# Patient Record
Sex: Male | Born: 1935 | Race: White | Hispanic: No | Marital: Married | Smoking: Never smoker
Health system: Southern US, Community
[De-identification: ages and names within clinical notes are randomized; demographics above are authoritative.]

## PROBLEM LIST (undated history)

## (undated) DIAGNOSIS — Z2809 Immunization not carried out because of other contraindication: Secondary | ICD-10-CM

## (undated) DIAGNOSIS — M1990 Unspecified osteoarthritis, unspecified site: Secondary | ICD-10-CM

## (undated) DIAGNOSIS — E785 Hyperlipidemia, unspecified: Secondary | ICD-10-CM

## (undated) DIAGNOSIS — I10 Essential (primary) hypertension: Secondary | ICD-10-CM

## (undated) DIAGNOSIS — Z23 Encounter for immunization: Secondary | ICD-10-CM

## (undated) DIAGNOSIS — R011 Cardiac murmur, unspecified: Secondary | ICD-10-CM

## (undated) DIAGNOSIS — N400 Enlarged prostate without lower urinary tract symptoms: Secondary | ICD-10-CM

## (undated) DIAGNOSIS — R002 Palpitations: Secondary | ICD-10-CM

## (undated) DIAGNOSIS — K562 Volvulus: Secondary | ICD-10-CM

## (undated) DIAGNOSIS — I428 Other cardiomyopathies: Secondary | ICD-10-CM

## (undated) DIAGNOSIS — C61 Malignant neoplasm of prostate: Secondary | ICD-10-CM

## (undated) DIAGNOSIS — N529 Male erectile dysfunction, unspecified: Secondary | ICD-10-CM

## (undated) DIAGNOSIS — G709 Myoneural disorder, unspecified: Secondary | ICD-10-CM

## (undated) HISTORY — DX: Unspecified osteoarthritis, unspecified site: M19.90

## (undated) HISTORY — PX: SMALL INTESTINE SURGERY: SHX150

## (undated) HISTORY — DX: Volvulus: K56.2

## (undated) HISTORY — DX: Immunization not carried out because of other contraindication: Z28.09

## (undated) HISTORY — DX: Encounter for immunization: Z23

## (undated) HISTORY — DX: Palpitations: R00.2

## (undated) HISTORY — DX: Essential (primary) hypertension: I10

## (undated) HISTORY — DX: Cardiac murmur, unspecified: R01.1

## (undated) HISTORY — DX: Male erectile dysfunction, unspecified: N52.9

## (undated) HISTORY — PX: EYE SURGERY: SHX253

## (undated) HISTORY — DX: Myoneural disorder, unspecified: G70.9

## (undated) HISTORY — DX: Other cardiomyopathies: I42.8

## (undated) HISTORY — DX: Hyperlipidemia, unspecified: E78.5

## (undated) HISTORY — PX: CERVICAL FUSION: SHX112

## (undated) HISTORY — DX: Malignant neoplasm of prostate: C61

## (undated) HISTORY — PX: PARTIAL COLECTOMY: SHX5273

## (undated) HISTORY — DX: Benign prostatic hyperplasia without lower urinary tract symptoms: N40.0

---

## 1993-08-12 HISTORY — PX: COLON RESECTION: SHX5231

## 2000-12-05 ENCOUNTER — Encounter: Payer: Self-pay | Admitting: Internal Medicine

## 2000-12-05 ENCOUNTER — Encounter: Admission: RE | Admit: 2000-12-05 | Discharge: 2000-12-05 | Payer: Self-pay | Admitting: Internal Medicine

## 2001-07-29 ENCOUNTER — Encounter: Payer: Self-pay | Admitting: Internal Medicine

## 2001-07-29 ENCOUNTER — Encounter: Admission: RE | Admit: 2001-07-29 | Discharge: 2001-07-29 | Payer: Self-pay | Admitting: Internal Medicine

## 2001-11-11 ENCOUNTER — Ambulatory Visit (HOSPITAL_COMMUNITY): Admission: RE | Admit: 2001-11-11 | Discharge: 2001-11-11 | Payer: Self-pay | Admitting: Gastroenterology

## 2001-11-11 ENCOUNTER — Encounter (INDEPENDENT_AMBULATORY_CARE_PROVIDER_SITE_OTHER): Payer: Self-pay | Admitting: *Deleted

## 2003-08-13 HISTORY — PX: PROSTATE SURGERY: SHX751

## 2004-01-10 ENCOUNTER — Encounter: Admission: RE | Admit: 2004-01-10 | Discharge: 2004-01-10 | Payer: Self-pay | Admitting: Internal Medicine

## 2005-08-22 ENCOUNTER — Ambulatory Visit (HOSPITAL_COMMUNITY): Admission: RE | Admit: 2005-08-22 | Discharge: 2005-08-22 | Payer: Self-pay | Admitting: Orthopaedic Surgery

## 2005-08-22 ENCOUNTER — Ambulatory Visit: Payer: Self-pay | Admitting: Cardiology

## 2005-08-23 ENCOUNTER — Encounter: Payer: Self-pay | Admitting: Cardiology

## 2005-08-23 ENCOUNTER — Ambulatory Visit: Payer: Self-pay

## 2005-08-27 ENCOUNTER — Ambulatory Visit: Payer: Self-pay | Admitting: Cardiology

## 2005-08-27 ENCOUNTER — Ambulatory Visit (HOSPITAL_COMMUNITY): Admission: RE | Admit: 2005-08-27 | Discharge: 2005-08-27 | Payer: Self-pay | Admitting: Cardiology

## 2005-09-12 ENCOUNTER — Ambulatory Visit: Payer: Self-pay | Admitting: Cardiology

## 2005-09-16 ENCOUNTER — Ambulatory Visit (HOSPITAL_COMMUNITY): Admission: RE | Admit: 2005-09-16 | Discharge: 2005-09-17 | Payer: Self-pay | Admitting: Orthopaedic Surgery

## 2006-04-09 ENCOUNTER — Ambulatory Visit: Payer: Self-pay | Admitting: Cardiology

## 2006-08-06 ENCOUNTER — Ambulatory Visit: Payer: Self-pay | Admitting: Cardiology

## 2006-08-06 ENCOUNTER — Inpatient Hospital Stay (HOSPITAL_COMMUNITY): Admission: AD | Admit: 2006-08-06 | Discharge: 2006-08-08 | Payer: Self-pay | Admitting: Orthopaedic Surgery

## 2006-08-12 DIAGNOSIS — Z2809 Immunization not carried out because of other contraindication: Secondary | ICD-10-CM

## 2006-08-12 HISTORY — DX: Immunization not carried out because of other contraindication: Z28.09

## 2006-09-16 ENCOUNTER — Ambulatory Visit: Payer: Self-pay | Admitting: Cardiology

## 2006-09-18 ENCOUNTER — Encounter: Payer: Self-pay | Admitting: Cardiology

## 2006-09-18 ENCOUNTER — Ambulatory Visit: Payer: Self-pay

## 2007-02-10 ENCOUNTER — Ambulatory Visit: Payer: Self-pay | Admitting: Cardiology

## 2008-04-19 ENCOUNTER — Ambulatory Visit: Payer: Self-pay | Admitting: Cardiology

## 2008-09-27 ENCOUNTER — Ambulatory Visit: Admission: RE | Admit: 2008-09-27 | Discharge: 2008-12-26 | Payer: Self-pay | Admitting: Radiation Oncology

## 2008-09-30 ENCOUNTER — Encounter: Admission: RE | Admit: 2008-09-30 | Discharge: 2008-09-30 | Payer: Self-pay | Admitting: Urology

## 2008-10-31 ENCOUNTER — Ambulatory Visit (HOSPITAL_BASED_OUTPATIENT_CLINIC_OR_DEPARTMENT_OTHER): Admission: RE | Admit: 2008-10-31 | Discharge: 2008-10-31 | Payer: Self-pay | Admitting: Urology

## 2009-04-16 DIAGNOSIS — E785 Hyperlipidemia, unspecified: Secondary | ICD-10-CM | POA: Insufficient documentation

## 2009-04-16 DIAGNOSIS — I10 Essential (primary) hypertension: Secondary | ICD-10-CM | POA: Insufficient documentation

## 2009-04-16 DIAGNOSIS — Z87448 Personal history of other diseases of urinary system: Secondary | ICD-10-CM | POA: Insufficient documentation

## 2009-04-16 DIAGNOSIS — R002 Palpitations: Secondary | ICD-10-CM | POA: Insufficient documentation

## 2009-04-16 DIAGNOSIS — I428 Other cardiomyopathies: Secondary | ICD-10-CM | POA: Insufficient documentation

## 2009-04-16 DIAGNOSIS — Z87898 Personal history of other specified conditions: Secondary | ICD-10-CM | POA: Insufficient documentation

## 2009-04-20 ENCOUNTER — Ambulatory Visit: Payer: Self-pay | Admitting: Cardiovascular Disease

## 2009-04-20 DIAGNOSIS — I10 Essential (primary) hypertension: Secondary | ICD-10-CM | POA: Insufficient documentation

## 2009-04-20 DIAGNOSIS — I429 Cardiomyopathy, unspecified: Secondary | ICD-10-CM | POA: Insufficient documentation

## 2009-04-20 DIAGNOSIS — I059 Rheumatic mitral valve disease, unspecified: Secondary | ICD-10-CM | POA: Insufficient documentation

## 2009-05-04 ENCOUNTER — Ambulatory Visit: Payer: Self-pay

## 2009-05-04 ENCOUNTER — Encounter: Payer: Self-pay | Admitting: Cardiovascular Disease

## 2010-04-24 ENCOUNTER — Ambulatory Visit: Payer: Self-pay | Admitting: Cardiovascular Disease

## 2010-04-24 DIAGNOSIS — M79609 Pain in unspecified limb: Secondary | ICD-10-CM | POA: Insufficient documentation

## 2010-04-25 ENCOUNTER — Encounter: Payer: Self-pay | Admitting: Cardiovascular Disease

## 2010-04-25 ENCOUNTER — Ambulatory Visit: Payer: Self-pay

## 2010-09-11 NOTE — Assessment & Plan Note (Signed)
Summary: f1y  Medications Added LEVITRA 20 MG TABS (VARDENAFIL HCL) take as directed as needed AMLODIPINE BESYLATE 5 MG TABS (AMLODIPINE BESYLATE) 1 tab once daily FLAX SEED OIL 1000 MG CAPS (FLAXSEED (LINSEED)) 3-4  caps daily      Allergies Added: NKDA  Visit Type:  1 yr f/u Primary Provider:  Burton Apley, MD  CC:  pt c/o right leg cramps...no other cardiac complaints today.  History of Present Illness: 75 yo WM with history of HTN, HL, cardiomyopathy (resolved), BPH and palpitations here today for follow up. He has been doing well. He has had treatment for prostate cancer. He has had no further palpitations. He denies chest pain, SOB, near syncope or syncope. Echo in September 2010 with normal LV function.  He has been feeling well.  He has stopped his beta blocker secondary to fatigue and erectile dysfunction. He has noticed right calf pain with ambulation. This resolves with rest. No edema.   Current Medications (verified): 1)  Aspirin Ec 325 Mg Tbec (Aspirin) .... Take One Tablet By Mouth Daily 2)  Multivitamins   Tabs (Multiple Vitamin) .Marland Kitchen.. 1 Tab Once Daily 3)  Lovastatin 20 Mg Tabs (Lovastatin) .Marland Kitchen.. 1 Tab At Bedtime 4)  Glucosamine 1500 Complex  Caps (Glucosamine-Chondroit-Vit C-Mn) .Marland Kitchen.. 1 Cap Once Daily 5)  Saw Palmetto 160 Mg Tabs (Saw Palmetto (Serenoa Repens)) .... 2 Tabs Once Daily 6)  Calcium 600 1500 Mg Tabs (Calcium Carbonate) .Marland Kitchen.. 1 Tab Once Daily 7)  Levitra 20 Mg Tabs (Vardenafil Hcl) .... Take As Directed As Needed 8)  Amlodipine Besylate 5 Mg Tabs (Amlodipine Besylate) .Marland Kitchen.. 1 Tab Once Daily 9)  Flax Seed Oil 1000 Mg Caps (Flaxseed (Linseed)) .... 3-4  Caps Daily  Allergies (verified): No Known Drug Allergies  Past History:  Past Medical History: Reviewed history from 04/16/2009 and no changes required. 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 Retired 2003 Married  Tobacco Use - Former.  Alcohol Use - no Regular Exercise - yes Drug Use - no  Review of Systems  The patient denies fatigue, malaise, fever, weight gain/loss, vision loss, decreased hearing, hoarseness, chest pain, palpitations, shortness of breath, prolonged cough, wheezing, sleep apnea, coughing up blood, abdominal pain, blood in stool, nausea, vomiting, diarrhea, heartburn, incontinence, blood in urine, muscle weakness, joint pain, leg swelling, rash, skin lesions, headache, fainting, dizziness, depression, anxiety, enlarged lymph nodes, easy bruising or bleeding, and environmental allergies.    Vital Signs:  Patient profile:   75 year old male Height:      73 inches Weight:      196.8 pounds BMI:     26.06 Pulse rate:   75 / minute Pulse rhythm:   irregular BP sitting:   136 / 80  (left arm) Cuff size:   regular  Vitals Entered By: Danielle Rankin, CMA (April 24, 2010 9:30 AM)  Physical Exam  General:  General: Well developed, well nourished, NAD Musculoskeletal: Muscle strength 5/5 all ext Psychiatric: Mood and affect normal Neck: No JVD, no carotid bruits, no thyromegaly, no lymphadenopathy. Lungs:Clear bilaterally, no wheezes, rhonci, crackles CV: RRR no murmurs, gallops rubs Abdomen: soft, NT, ND, BS present Extremities: No edema, pulses 1+ bilateral DP, non-palpable bilateral PT    Echocardiogram  Procedure date:  05/04/2009  Findings:      1. Left ventricle: The cavity size  was at the upper limits of        normal. Wall thickness was at the upper limits of normal. The        estimated ejection fraction was 60%. Wall motion was normal;        there were no regional wall motion abnormalities.     2. Aortic valve: Trivialregurgitation.     3. Mitral valve: There is mild prolapse of the posterior  leaflet.        There is a late MR jet from this. The Pissa data can be        missleading because the jet is late. Overall I feel the MR is        mild.     4. Left atrium: The atrium was mildly dilated.  EKG  Procedure date:  04/24/2010  Findings:      Sinus rhythm, rate 75 bpm. PVCs.   Impression & Recommendations:  Problem # 1:  LIMB PAIN (ICD-729.5)  Will get ABIs. May be consistent with claudication.   Orders: Arterial Duplex Lower Extremity (Arterial Duplex Low)  Problem # 2:  PALPITATIONS (ICD-785.1)  He is not aware of any palpitations. LV function is normal. PVCs on EKG. He does not wish to take therapy for this. In the setting of normal LV function, PVCs are benign.   The following medications were removed from the medication list:    Metoprolol Tartrate 25 Mg Tabs (Metoprolol tartrate) .Marland Kitchen... 1/2 tab two times a day His updated medication list for this problem includes:    Aspirin Ec 325 Mg Tbec (Aspirin) .Marland Kitchen... Take one tablet by mouth daily    Amlodipine Besylate 5 Mg Tabs (Amlodipine besylate) .Marland Kitchen... 1 tab once daily  Orders: EKG w/ Interpretation (93000)  Patient Instructions: 1)  Your physician recommends that you continue on your current medications as directed. Please refer to the Current Medication list given to you today. 2)  Your physician has requested that you have an ankle brachial index (ABI). During this test an ultrasound and blood pressure cuff are used to evaluate the arteries that supply the arms and legs with blood. Allow thirty minutes for this exam. There are no restrictions or special instructions. 3)  Your physician has requested that you have an echocardiogram in 1 year. Echocardiography is a painless test that uses sound waves to create images of your heart. It provides your doctor with information about the size and shape of your heart and how well your heart's chambers and valves are working.  This procedure takes approximately one hour.  There are no restrictions for this procedure. 4)  Your physician recommends that you schedule a follow-up appointment in: 1 year

## 2010-11-22 LAB — CBC
HCT: 40 % (ref 39.0–52.0)
Hemoglobin: 13.9 g/dL (ref 13.0–17.0)
MCHC: 34.7 g/dL (ref 30.0–36.0)
MCV: 88 fL (ref 78.0–100.0)
Platelets: 131 10*3/uL — ABNORMAL LOW (ref 150–400)
RBC: 4.54 MIL/uL (ref 4.22–5.81)
RDW: 12.4 % (ref 11.5–15.5)
WBC: 5.8 10*3/uL (ref 4.0–10.5)

## 2010-11-22 LAB — COMPREHENSIVE METABOLIC PANEL
ALT: 18 U/L (ref 0–53)
AST: 22 U/L (ref 0–37)
Albumin: 3.5 g/dL (ref 3.5–5.2)
Alkaline Phosphatase: 56 U/L (ref 39–117)
BUN: 14 mg/dL (ref 6–23)
CO2: 29 mEq/L (ref 19–32)
Calcium: 9.1 mg/dL (ref 8.4–10.5)
Chloride: 105 mEq/L (ref 96–112)
Creatinine, Ser: 1.07 mg/dL (ref 0.4–1.5)
GFR calc Af Amer: >60 mL/min (ref >60)
GFR calc non Af Amer: >60 mL/min (ref >60)
Glucose, Bld: 99 mg/dL (ref 70–99)
Potassium: 4.8 mEq/L (ref 3.5–5.1)
Sodium: 138 mEq/L (ref 135–145)
Total Bilirubin: 0.8 mg/dL (ref 0.3–1.2)
Total Protein: 6.1 g/dL (ref 6.0–8.3)

## 2010-11-22 LAB — APTT: aPTT: 29 seconds (ref 24–37)

## 2010-11-22 LAB — PROTIME-INR
INR: 1 (ref 0.00–1.49)
Prothrombin Time: 13.6 seconds (ref 11.6–15.2)

## 2010-12-25 NOTE — Assessment & Plan Note (Signed)
[facility]                            CARDIOLOGY OFFICE NOTE   AZAN, MANERI                      MRN:          [id number]  DATE:04/19/2008                            DOB:          July 24, 1936    HISTORY OF PRESENT ILLNESS:  Mr. Hellberg is a pleasant 75 year old  Caucasian male with a past medical history significant for hypertension,  hyperlipidemia, erectile dysfunction, benign prostatic hypertrophy,  palpitations, and cardiomyopathy that has resolved over the last several  years, who presents today for routine cardiology followup.  Mr. Mirabile  was followed by Dr. Nona Dell in this office, and because of Dr.  Ival Bible transfer to the [facility] office, Mr. Seppala will begin following  in my clinic.  He states that he has been doing very well and denies  having noticed any palpitations at all.  He also denies any chest  discomfort, shortness of breath, dizziness, near syncope, syncope,  orthopnea, paroxysmal nocturnal dyspnea, or lower extremity edema.  He  stated that he has been busy in his church assisting and planning for  building of a new structure.  He most recently had an echocardiogram  performed on September 18, 2006 that showed normal left ventricular size  and normal left ventricular systolic function.  There was trivial aortic  valve regurgitation and borderline mitral valve prolapse.  The left  atrium was mildly dilated.   PAST MEDICAL HISTORY:  Significant for benign prostatic hypertrophy,  erectile dysfunction, hypertension, hyperlipidemia, palpitations that  were felt to be secondary to ventricular ectopy, and a mild  cardiomyopathy that has since resolved.   PAST SURGICAL HISTORY:  Significant for cervical spine surgery and an  intestinal surgery.   ALLERGIES:  He has no known drug allergies.   CURRENT MEDICATIONS INCLUDE:  1. Flomax 0.4 mg once daily.  2. Triamterene/hydrochlorothiazide 37.5/25 mg once daily.  3. Aspirin  325 mg once daily.  4. Multivitamins once daily.  5. Lovastatin 20 mg once daily.  6. Vitamin C 500 mg once daily.  7. Glucosamine 1500 mg once daily.  8. Saw Palmetto 320 mg once daily.  9. Metoprolol tartrate 12.5 mg twice daily.  10.Calcium 600 mg once daily.  11.Omega-3.  12.Fish oil 300 mg, 3 times daily.  13.Viagra p.r.n.   SOCIAL HISTORY:  The patient is a retired Airline pilot.  He is married and  has 2 adult children.  He denies use of tobacco, alcohol, or illicit  drugs.   FAMILY HISTORY:  The patient's mother and father both had coronary  artery disease.   REVIEW OF SYSTEMS:  As stated in the history of present illness, is  otherwise negative.   PHYSICAL EXAMINATION:  GENERAL:  He is a pleasant elderly Caucasian  male, in no acute distress.  VITAL SIGNS:  Weight 200 pounds, blood pressure 104/78, pulse 59 and  regular, respirations 12 and nonlabored.  NECK:  No JVD.  No carotid bruits.  No lymphadenopathy.  No thyromegaly.  SKIN:  Warm and dry.  HEENT:  Oropharynx clear.  Mucous membranes moist.  LUNGS:  Clear to auscultation bilaterally without wheezes,  rhonchi, or  crackles noted.  CARDIOVASCULAR:  Regular rate and rhythm without murmurs, gallops, or  rubs noted.  No lifts or thrills are noted.  There are normal first and  second heart sounds.  ABDOMEN:  Soft and nontender.  Bowel sounds present.  EXTREMITIES:  No evidence of edema.  Pulses are 2+ in all extremities.   DIAGNOSTIC STUDIES:  A 12-lead electrocardiogram obtained in our office  today shows sinus bradycardia and nonspecific T-wave flattening.  The  ventricular rate is 59 beats per minute.  The PR interval is 180  milliseconds.  The corrected QT interval was 405 milliseconds.   ASSESSMENT AND PLAN:  This is a pleasant 75 year old Caucasian male with  previously documented mild cardiomyopathy with ventricular ectopy that  has now resolved.  The patient has no awareness of any palpitations  since taking  beta-blocker therapy.  His most recent echocardiogram  performed over a year ago showed normalization of his left ventricular  function.  His blood pressure is well controlled today, and he is  asymptomatic.  I will continue his current medication regimen as  outlined above.  I have encouraged him to continue to follow with Dr.  Su Hilt for his primary care.  I will see him in our Cardiology Office  in 1 year.  He is aware that he should let us know should his symptoms  change in character over the ensuing 12 months.     Verne Carrow, MD  Electronically Signed    CM/MedQ  DD: 04/19/2008  DT: 04/20/2008  Job #: [id number]   cc:   Antony Madura, M.D.  Rollene Rotunda, MD, [facility]

## 2010-12-25 NOTE — Assessment & Plan Note (Signed)
[facility]                            CARDIOLOGY OFFICE NOTE   Kevin Knight                      MRN:          [id number]  DATE:02/10/2007                            DOB:          [birth year]    PRIMARY CARE PHYSICIAN:  Dr. Burton Apley.   REASON FOR VISIT:  Followup premature ventricular complexes and  cardiomyopathy.   HISTORY OF PRESENT ILLNESS:  Kevin Knight returns for a routine visit. He  continues to do well. He describes occasional palpitations sometimes  when he over does it, but he has had no dizziness or syncope. He has  had no spontaneous palpitations at rest. We proceeded with an  echocardiogram following his last visit, which actually showed an  improvement in his ejection fraction to normal, with no focal wall  motion abnormalities.   ALLERGIES:  No known drug allergies.   PRESENT MEDICATIONS:  1. Flomax 0.4 mg p.o. daily.  2. Triamterine/hydrochlorothiazide 37.5/25 mg p.o. daily  3. Aspirin 325 mg p.o. daily.  4. Multivitamin 1 p.o. daily.  5. Lovastatin 20 mg p.o. daily.  6. Toprol XL 25 mg p.o. daily.  7. Glucosamine.  8. Saw palmetto.  9. Vitamin C.   REVIEW OF SYSTEMS:  As described in history of present illness.   PHYSICAL EXAMINATION:  Blood pressure 121/69, heart rate is 59, weight  is 200 pounds. Patient is comfortable and in no acute distress.  Examination of the neck reveals no elevated jugular venous pressure  without bruits.  LUNGS: Clear without labored breathing.  CARDIAC EXAM: Reveals a regular rate and rhythm. No extrasystole is  noted. Soft systolic murmur.  EXTREMITIES: Show no pitting edema.   IMPRESSION/RECOMMENDATIONS:  1. Previously documented mild cardiomyopathy with ventricular ectopy,      now with improvement to normal ejection fraction by follow up      echocardiography in February. Our plan is to continue medical      therapy and observation. He plans to see Dr. Su Hilt back in  December and we will see him back in 1 years time.  2. Further plans to follow.     Kevin Sidle, MD  Electronically Signed    SGM/MedQ  DD: 02/10/2007  DT: 02/11/2007  Job #: [id number]   cc:   Kevin Knight, M.D.

## 2010-12-25 NOTE — Op Note (Signed)
Kevin Knight, Kevin Knight               ACCOUNT NO.:  [id number]   MEDICAL RECORD NO.:  [id number]          PATIENT TYPE:  AMB   LOCATION:  [facility]                         FACILITY:  [facility]   PHYSICIAN:  Mark C. Vernie Ammons, M.D.  DATE OF BIRTH:  1936-06-10   DATE OF PROCEDURE:  10/31/2008  DATE OF DISCHARGE:                               OPERATIVE REPORT   PREOPERATIVE DIAGNOSIS:  Adenocarcinoma of the prostate.   POSTOPERATIVE DIAGNOSIS:  Adenocarcinoma of the prostate. (Clinical  stage T1c)   PROCEDURE:  I 125 seed implantation.   SURGEON:  Mark C. Vernie Ammons, M.D.   RADIATION ONCOLOGIST:  Artist Pais. Kathrynn Running, M.D.   ANESTHESIA:  General.   DRAINS:  16-French Foley catheter.   BLOOD LOSS:  Approximately 50 mL.   SPECIMENS:  None.   COMPLICATIONS:  None.   INDICATIONS:  The patient is a 75 year old male who was found to have a  slightly elevated PSA of 4.08.  A prostate biopsy revealed some  inflammation as well as Gleason 6 adenocarcinoma involving 5% of one  core from the right apex.  I have discussed all treatment options  available to the patient and he has elected to proceed with radioactive  seed implantation.  The risks, the procedure risks and complications  have been discussed with the patient.   DESCRIPTION OF OPERATION:  After informed consent, the patient was  brought to the major OR and placed on the table and administered general  anesthesia.  He was then moved to the dorsal lithotomy position and his  genitalia as well as perineum were sterilely prepped and draped.  An  official time-out was then performed.  Dr. Kathrynn Running then placed the  transrectal ultrasound probe in the rectum and affixed this to the stand  as well as placing a rectal tube and 16-French Foley catheter in the  bladder filling the balloon with half-strength contrast.  He then  performed real time mapping of the prostate and made a plan based on  that ultrasound mapping.  Once was this was complete, I  entered and a  second official time-out was then performed.   The I 125 seeds were then placed according to the plan using the  Nucletron device and real-time ultrasound.  This proceeded without  complication and a total of 25 needles were used to place a total of 82  seeds.   The ultrasound probe and rectal probe were then removed as well as the  Foley catheter.  I then performed flexible cystoscopy and noted the  urethra to be entirely normal down the sphincter which appeared intact.  The prostatic urethra revealed a single seed within the prostatic  urethra.  It was slightly elongated with some mild bilobar hypertrophy  but no prostatic urethral lesions were identified and there was no  active bleeding noted.  Upon entering the bladder I did a systematic  inspection of the bladder, noted no tumor, stones or inflammatory  lesions.  Ureteral orifices were of normal configuration and position  and I also retroflexed the retroflexed the scope and noted no seeds  protruding from the  base of the prostate.   I then used a grasper to grasp the single seed that had flushed into the  bladder and removed that.  I then reinspected the bladder, noted no  bleeding or seeds present and therefore removed the cystoscope and  reinserted a new 16-French Foley catheter and connected this to close  system drainage.  The patient tolerated the procedure well and was  awakened and taken to recovery room in stable satisfactory condition.   He will be given a prescription for 28 Vicodin ES, for ten 500 mg Cipro  and remain on Flomax 0.4 mg q.h.s.  He was given written instructions  and his follow-up will be in 3 weeks to see Dr. Kathrynn Running and myself at  that time.      Mark C. Vernie Ammons, M.D.  Electronically Signed     MCO/MEDQ  D:  10/31/2008  T:  10/31/2008  Job:  [id number]   cc:   Artist Pais Kathrynn Running, M.D.  Fax: [phone]

## 2010-12-28 NOTE — Op Note (Signed)
Kevin Knight, Kevin Knight               ACCOUNT NO.:  [id number]   MEDICAL RECORD NO.:  [id number]          PATIENT TYPE:  AMB   LOCATION:  [facility]S                          FACILITY:  [facility]   PHYSICIAN:  Mark C. Ophelia Charter, M.D.    DATE OF BIRTH:  [birth year]   DATE OF PROCEDURE:  08/06/2006  DATE OF DISCHARGE:                               OPERATIVE REPORT   PREOPERATIVE DIAGNOSIS:  C5-6, C6-7 pseudoarthrosis, status post  anterior cervical fusion with autograft and plating.   POSTOPERATIVE DIAGNOSIS:  C5-6, C6-7 pseudoarthrosis, status post  anterior cervical fusion with autograft and plating.   PROCEDURE:  C5 to C7 posterior cervical fusion, spinous process wiring,  iliac crest aspirate and Vitoss.   SURGEON:  Mark C. Ophelia Charter, M.D.   ANESTHESIA:  GOT plus Marcaine local.   ESTIMATED BLOOD LOSS:  200 cc.   COMPLICATIONS:  Intraoperative bradycardia with some hypotension and  ventricular ectopy.   INDICATIONS FOR PROCEDURE:  This 75 year old male had previous cervical  fusion anteriorly and had a normal cardiac cath 1 year ago, when it was  noted that he had some occasional ventricular ectopy.  Due to his  persistent symptoms, he was taken to the operating room for a posterior  cervical fusion.   DESCRIPTION OF PROCEDURE:  Standard prepping and draping were performed.  Preoperative Ancef was given.  Time-out was taken.  The area was squared  with towels at the iliac crest posteriorly on the right and the  posterior cervical spine.  A midline incision was made.  Subperiosteal  dissection.  C7, C6 and C5 were isolated.  The laminae were identified.  There was minimal motion at these levels, all consistent with  pseudoarthrosis versus levels  above and below, which had significantly  more motion.  Cross-table lateral C spine x-ray with a Kocher at the  space between 4 and 5 and one between 5 and 6 was performed, confirming  the suspected levels.  A 24-gauge wire was twisted into a table  and  passed underneath the base of C7, around the top of C5, leaving the  interspinous ligament intact.  The wire was twisted down tightly,  grabbing with a Kocher at C7, and there was motion as 1 unit.  Decortication of the bone after right iliac crest aspirate with a stab  incision; 8 cc of bone marrow aspirate mixed with a 10-cc strip of  Vitoss.  The strip was cut in half after sitting for 5 minutes, while  the bone was being decorticated.  Half was packed on each side down to  bleeding bone surface.  The wire had been cut and twisted down securely.  Deep layers were closed with 0 Vicryl, 2-0 in the subcutaneous tissue.  Initially, there was exposure being made down to the periosteum.  The  patient had bradycardia down to high 20s, low 30s.  This responded with  anesthesia and some atropine, and for the rest of the case he showed  some ventricular ectopy without ST segment changes.  After irrigation, 0  Vicryl in the fascia, 2-0 Vicryl and skin staple closure.  Postop  dressing.  Soft cervical collar.  Transferred to the recovery room.      Mark C. Ophelia Charter, M.D.  Electronically Signed     MCY/MEDQ  D:  08/06/2006  T:  08/06/2006  Job:  [id number]

## 2010-12-28 NOTE — Op Note (Signed)
NAMEZIAIRE, HAGOS               ACCOUNT NO.:  [id number]   MEDICAL RECORD NO.:  [id number]          PATIENT TYPE:  OIB   LOCATION:  5028                         FACILITY:  [facility]   PHYSICIAN:  Mark C. Ophelia Charter, M.D.    DATE OF BIRTH:  26-Oct-1935   DATE OF PROCEDURE:  09/16/2005  DATE OF DISCHARGE:                                 OPERATIVE REPORT   PREOPERATIVE DIAGNOSIS:  Cervical spondylosis with stenosis, C5-6 and C6-7.   POSTOPERATIVE DIAGNOSIS:  Cervical spondylosis with stenosis, C5-6 and C6-7.   PROCEDURE:  C5-6 and C6-7 anterior cervical diskectomies and fusion,  allograft and plating.   SURGEON:  Mark C. Ophelia Charter, M.D.   ANESTHESIA:  GOT plus Marcaine to skin locally.   ESTIMATED BLOOD LOSS:  Minimal.   DRAINS:  One Hemovac to neck.   PROCEDURE:  After induction of general anesthesia and orotracheal intubation  using a horseshoe head-holder, careful positioning, traction set up without  weight, the neck was prepped with DuraPrep and preoperative vancomycin was  given IV.  Area was squared with towels, sterile skin maker used, Betadine  and Vi-Drape applied, sterile Mayo stand at the head and thyroid sheet.  A  midline incision was started at the C6 vertebra, starting in the midline and  extending to the left in line with the skin folds.  Platysma was divided in  line with the skin incision, blunt dissection down to the level of the  longus colli with carotid sheath and contents lateral.  A spur at C5-6 was  identified.  Cross-table lateral C-arm spot picture confirmed that this was  at the C5-6 level.  Spur was removed with a bur after Cloward toothed  retractors were placed right and left, smooth blades up and down.  Diskectomy was performed with a scalpel, Cloward curettes and pituitary.  Bur was used to remove the spurs anteriorly and to drill laterally to the  uncovertebral joints with care taken for anatomic positioning of her  vertebral artery.  Once the disk  material was removed back close to the  posterior longitudinal ligament, operative microscope was draped and brought  in and microdissection techniques were used, removing the spurs.  There was  overgrowth of C5 over C6 vertebral body posteriorly and spurs had to be  thinned down and thin picked with microcurettes and then removed with a 1-mm  Kerrison.  The posterior longitudinal ligament was completely removed,  exposing the dura, right and left.  Uncovertebral joints were removed in  both lateral gutters to relieve compression and foraminal narrowing.  With  good decompression of the dura with distraction and trial, a 6-mm allograft  was selected, placed, slightly countersunk based on depth-gauge  measurements.  An identical procedure was repeated at the C6-7 level.  There  was not as much spur overgrowth.  There was still significant stenosis with  decompression performed, stripping the uncovertebral joints, placement of  the second graft parallel to the first graft based on the line on the  anterior aspect of the graft and using the DBX putty.  After placement of  the second  graft, a 34-mm Synthes light-blue-colored plate was selected and  14-mm gold screws were selected and then the set screws and locking screws  placed;  After it was checked under fluoroscopy, screws were angled cephalad  on the proximal and slightly caudad at the inferior aspect.  Good position  of the grafts.  Oblique views had to be used to check the bottom level due  to the shoulders.  After  irrigation with saline solution, Hemovac was placed in line with the fibers.  Operative field was dry.  Platysma was reapproximated with 3-0 Vicryl, 4-0  Vicryl and subcuticular skin closure, tincture of Benzoin, Steri-Strips, 4 x  4 and dressing with soft collar.  Instrument count and needle count were  correct.      Mark C. Ophelia Charter, M.D.  Electronically Signed     MCY/MEDQ  D:  09/16/2005  T:  09/17/2005  Job:   [id number]

## 2010-12-28 NOTE — Discharge Summary (Signed)
Kevin Knight, Kevin Knight               ACCOUNT NO.:  [id number]   MEDICAL RECORD NO.:  [id number]          PATIENT TYPE:  INP   LOCATION:  4704                         FACILITY:  [facility]   PHYSICIAN:  Mark C. Ophelia Charter, M.D.    DATE OF BIRTH:  14-Feb-1936   DATE OF ADMISSION:  08/06/2006  DATE OF DISCHARGE:  08/08/2006                               DISCHARGE SUMMARY   FINAL DIAGNOSES:  1. C5-6, C6-7 anterior effusion with pseudoarthrosis.  2. Intraoperative bradycardia with hypotension, possibly secondary to      vasovagal reaction.   PROCEDURE:  C5-C7 posterior effusion wiring Vitoss iliac crest aspirate.   This 75 year old male had anterior cervical fusion February of 2007 with  persistent pain.  Workup included a CT scan, which showed  pseudoarthrosis consistent with motion on flexion and extension x-rays.  He had no evidence of harbor or breakage.  He was brought in for  posterior effusion.   ADMISSION MEDICATIONS:  Included:  1. Triamterene/hydrochlorothiazide 37.5/25 mg p.o. daily.  2. Aspirin 1 a day.  3. Metoprolol 25 mg daily.  4. Viagra 100 mg daily.  5. Sulindac 200 mg b.i.d.  6. Lovastatin 200 mg daily.  7. Vitamin E.  8. Multivitamin.  9. Saw palmetto.  10.Glucosamine.  11.He also used Flomax 0.4 mg p.o. daily.   HOSPITAL COURSE:  The patient was admitted after informed consent and  underwent C5-6, C6-7 posterior effusion with wiring, triple strand  wiring, and posterior effusion with the iliac crest aspirate and Vitoss.  He was maintained in a collar postoperatively for comfort.  Made good  progress.  He was seen by our cardiology service.  Patient had  intraoperatively dropped his blood pressure, had associated bradycardia  with subperiosteal stripping posteriorly.  Elevated enzymes as expected  postoperatively after muscle stripping with CPK 281, MB band 11.6.  Troponin was 1.03.  His preoperative hand numbness and tingling improved  postoperatively.  He was seen by  the cardiology service and felt that it  mild represent mild ischemia or myocardial straining in the setting of  bradycardia, now resolving.  Plans were made a followup and he was to  continue with Toprol 25 mg and 1 aspirin a day and he  was voiced for discharge.  Incision was clean and dry.  Dressing was  changed.  He was taking p.o. pain medication and had no further problems  with bradycardia or hypotension.   FOLLOWUP APPOINTMENT:  With Dr. Ophelia Charter and follow up with cardiology  postoperatively 1-2 weeks.      Mark C. Ophelia Charter, M.D.  Electronically Signed     MCY/MEDQ  D:  09/19/2006  T:  09/20/2006  Job:  [id number]

## 2010-12-28 NOTE — H&P (Signed)
NAMECLEARNCE, LEJA               ACCOUNT NO.:  [id number]   MEDICAL RECORD NO.:  [id number]          PATIENT TYPE:  INP   LOCATION:  4704                         FACILITY:  [facility]   PHYSICIAN:  Everardo Beals. Juanda Chance, MD, FACCDATE OF BIRTH:  [birth year]   DATE OF ADMISSION:  08/06/2006  DATE OF DISCHARGE:                              HISTORY & PHYSICAL   REFERRING PHYSICIAN:  Annell Greening   REASON FOR REFERRAL:  Bradycardia and hypotension intraoperatively.   CLINICAL HISTORY:  Mr. Vanderweele is 75 years old and today had cervical  spine surgery by Dr. Annell Greening.  Shortly after induction when the  patient was having periosteal stripping during cervical disc fusion, he  developed profound bradycardia with rates in the 20s and a fall in his  blood pressure.  He was treated with atropine and epinephrine and his  blood pressure and heart rate responded to this and the surgery was able  to be completed.  After recovering from anesthesia, he had no symptoms  of chest pain, shortness of breath or palpitations.   Mr. Lembke has a history of nonobstructive coronary angiography in  January of this year.  He has had no recent symptoms of chest pain,  shortness of breath or palpitations.  He said on one occasion he was  noted to have bradycardia in the 40s.  He does take Toprol.   PAST MEDICAL HISTORY:  Significant for:  1. Hypertension.  2. Hyperlipidemia.  3. BPH.  4. He has had a part of his intestine resected for intestinal torsion.  5. He also a history of PVCs.  6. He has had previous cervical disc surgery.   HOME MEDICATIONS:  Include:  1. Aspirin.  2. Flomax.  3. Triamterene/hydrochlorothiazide.  4. Sulindac.  5. Lovastatin.  6. Toprol XL 25 mg daily.   SOCIAL HISTORY:  He is a retired Airline pilot.  He is married and has two  children.  He does not drink or smoke.   FAMILY HISTORY:  His mother had both a stroke and coronary artery  disease and his father died of coronary artery disease  and renal  failure.   REVIEW OF SYSTEMS:  Negative for chest pain, shortness of breath and  palpitations.   PHYSICAL EXAMINATION:  VITAL SIGNS:  Blood pressure was 110/58 and the  pulse 64 and regular.  NECK:  Due to a cervical collar, I could not examine his neck including  his carotids or his neck vein distention.  CARDIOVASCULAR:  The cardiac rhythm was regular and the heart sounds  were normal.  There were no murmurs or gallops.  CHEST:  Clear without rales or rhonchi.  ABDOMEN:  Soft with normal bowel sounds.  There was no  hepatosplenomegaly.  There was a midline surgical scar.  EXTREMITIES:  Peripheral pulses were full with no peripheral edema.  MUSCULOSKELETAL SYSTEM:  Showed no deformities.  He did have a cervical  collar.  SKIN:  Warm and dry.  NEUROLOGICAL EXAMINATION:  Showed no focal neurological signs.   An electrocardiogram showed normal sinus rhythm and was normal.   IMPRESSION:  1.  Intraoperative bradycardia and hypotension, probably secondary to      vasovagal reaction.  2. Status post cervical spine surgery with cervical fusion.  3. History of nonobstructive coronary disease at catheterization in      January of 2007.  4. History of hypertension.  5. Hyperlipidemia.   RECOMMENDATIONS:  Will plan to check serial cardiac enzymes and recheck  an electrocardiogram in the morning.  If all these studies are negative  and the patient remains stable, I think he probably can still be  discharged home tomorrow without further evaluation.  Will arrange  followup with Dr. Diona Browner in the future.      Bruce Elvera Lennox Juanda Chance, MD, [facility]  Electronically Signed     BRB/MEDQ  D:  08/06/2006  T:  08/07/2006  Job:  [id number]   cc:   Jonelle Sidle, MD

## 2010-12-28 NOTE — Cardiovascular Report (Signed)
NAMEPADDY, NEIS NO.:  [id number]   MEDICAL RECORD NO.:  [id number]          PATIENT TYPE:  OIB   LOCATION:  2899                         FACILITY:  [facility]   PHYSICIAN:  Charlies Constable, M.D. 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  He is scheduled for neck surgery by Dr. Annell Greening.  He was seen in  preoperative consultation by Dr. Diona Browner who noted he had frequent PVCs and  arranged for him to have a stress Cardiolite scan.  The Cardiolite scan  suggested inferior ischemia and so he was set up for evaluation with  catheterization today.  The study was not gated with Atropine so no ejection  fraction was calculated.   DESCRIPTION OF PROCEDURE:  The procedure was performed via the right femoral  artery using __________  and 6-French __________  coronary catheters.  A  __________  arterial branch was performed and Omnipaque contrast was used.  The right femoral artery with closed with AngioSeal at the end of the  procedure.  The patient tolerated the procedure well and left the laboratory  in satisfactory condition.   RESULTS:  Left main coronary arteriogram:  The left main is free of  significant disease.  Left anterior descending artery:  The left anterior descending artery gave  rise to an early diagonal branch, a second diagonal branch and a septal  perforator.  The LAD was irregular but there was no significant obstruction.  The circumflex artery:  The circumflex artery gave rise to a marginal branch  and posterolateral branch.  These vessels were free of significant disease.  Right coronary artery:  The right coronary artery is a moderate size vessel,  gave rise to a marginal branch, posterior descending artery branch and three  posterolateral branches.  The right coronary artery was  irregular but there  was no significant obstruction.   LEFT VENTRICULOGRAM:  The left ventriculogram showed __________  obstruction  showed mild global hypokinesis with an estimated ejection fraction was 45-  50%.   The aortic pressure was 133/79 with mean of 104 and left ventricular  pressure was 133/11.   CONCLUSION:  1.  No significant obstructive coronary artery disease with minimal luminal      irregularities in the left anterior descending and right coronary      artery.  2.  Possible mild global hypokinesis with an estimated ejection fraction of      45-50%.   RECOMMENDATIONS:  The patient does not have obstructive coronary disease.  He may have some mild global hypokinesis and could possibly have an early  cardiomyopathy as an explanation for his PVCs. Will plan further evaluation  with echocardiography and decide if any medical therapy is needed depending  on the results of this.  I think it is okay to proceed with neck surgery.   He had marked dilated loops of intestine on fluoroscopy, so we are getting a  KUB  today to see if any further evaluation is needed for that.  He has seen  Dr. Ewing Schlein in the past and has had a previous partial intestinal resection  for what sounds like volvulus.           ______________________________  Charlies Constable, M.D. [facility]     BB/MEDQ  D:  08/27/2005  T:  08/28/2005  Job:  [id number]   cc:   Jonelle Sidle, M.D. [facility]  [address]  [place]  [place] [address]   Antony Madura, M.D.  Fax: [phone]   Veverly Fells. Ophelia Charter, M.D.  Fax: [phone]   Cardiopulmonary Lab

## 2010-12-28 NOTE — Procedures (Signed)
[facility]  Patient:    DEREN, DEGRAZIA Visit Number: [id number] MRN: [id number]          Service Type: END Location: ENDO Attending Physician:  Nelda Marseille Dictated by:   Petra Kuba, M.D. Proc. Date: 11/11/01 Admit Date:  11/11/2001 Discharge Date: 11/11/2001   CC:         Antony Madura, M.D.   Procedure Report  PROCEDURE PERFORMED:  Flexible sigmoidoscopy and colonoscopy with hot biopsy.  ENDOSCOPIST:  Petra Kuba, M.D.  INDICATIONS FOR PROCEDURE:  Screening.  Consent was signed after risks, benefits, methods, and options were thoroughly discussed with Mr. Boruff in the past and with my nurse.  MEDICATIONS USED:  Demerol 50 mg, Versed 5 mg.  DESCRIPTION OF PROCEDURE:  Rectal inspection was pertinent for external hemorrhoids small.  Digital exam was negative.  Video colonoscope was inserted, easily advanced to the entire length of the colonoscope.  It appeared that he had a midsigmoid anastomosis.  We were able to advance through about 100 cm of terminal ileum.  No gross obvious abnormality was seen on insertion, the scope was slowly withdrawn.  The prep was adequate.  There was some liquid stool that required suctioning only.  The terminal ileum was normal.  The anastomosis was identified at about 30 to 35 cm.  The scope was slowly withdrawn back to the rectum.  Three questionable tiny polyps were seen and were all hot biopsied.  Two on retroflexion, one on straight visualization put in the same container.  On retroflex visualization, some internal hemorrhoids small were seen.  Scope was straightened, air was suctioned, scope removed.  The patient tolerated the procedure well.  There was no obvious immediate complication.  ENDOSCOPIC DIAGNOSIS: 1. Internal and external hemorrhoids. 2. Three tiny questionable rectal polyps hot biopsied. 3. Anastomosis at about 30 to 35 cm. 4. Dilated terminal ileum with exam  approximately 100 cm up the terminal ileum    without abnormality seen.  PLAN:  Await pathology to determine future colonic screening.  Happy to see back p.r.n.  Otherwise return care to Dr. Su Hilt with the customary health care maintenance to include yearly rectals and guaiacs. Dictated by:   Petra Kuba, M.D. Attending Physician:  Nelda Marseille DD:  11/11/01 TD:  11/12/01 Job: [id number]

## 2010-12-28 NOTE — Assessment & Plan Note (Signed)
[facility]                              CARDIOLOGY OFFICE NOTE   JACLYN, CAREW                      MRN:          [id number]  DATE:04/09/2006                            DOB:          [birth year]    PRIMARY CARE PHYSICIAN:  Dr. Burton Apley.   REASON FOR VISIT:  Routine cardiac followup.   HISTORY OF PRESENT ILLNESS:  I saw Mr. Arrazola back in February.  He continues  to do well without any complaints of significant palpitations, dyspnea, or  chest pain.  He [place] recently, up  to 1200 feet elevation, and he did experience some fatigue with this.  Otherwise, he does not have any major new symptoms and is overall tolerating  his Toprol-XL well.  His electrocardiogram today is stable showing sinus  bradycardia with nonspecific T wave changes and no ventricular ectopy.  Today we talked about continuing his medical therapy and, ultimately, and  followup echocardiogram down the road to reassess left ventricular function.   ALLERGIES:  NO KNOWN DRUG ALLERGIES.   PRESENT MEDICATION:  Aspirin 325 mg p.o. daily, Flomax 0.4 mg p.o. daily,  triamterene/hydrochlorothiazide 37.5/25 mg p.o. daily, multivitamin 1 p.o.  daily, sulindac 200 mg p.o. b.i.d., lovastatin 20 mg p.o. daily, and Toprol-  XL 25 mg p.o. daily.   REVIEW OF SYSTEMS:  As described in the history present illness.   PHYSICAL EXAMINATION:  VITAL SIGNS:  Blood pressure 120/70, heart rate 63,  weight is 197 pounds.  GENERAL:  The patient is comfortable, in no acute distress.  NECK:  Examination of the neck reveals no elevated jugular venous pressure.  LUNGS:  Clear without labored breathing.  CARDIAC EXAM:  Reveals a regular rate and rhythm without rub, murmur, or  gallop.  EXTREMITIES:  Showed no pitting edema.   IMPRESSION AND RECOMMENDATION:  1. History of frequent ventricular ectopy, improved significantly on beta      blocker therapy.   This is in the setting of previously noted inferior      apical wall motion abnormalities and mildly reduced ejection fraction.      Patient is free of any congestive heart failure symptoms or angina and      had previously documented minimal luminal coronary irregularities at      catheterization in January of this year.  We will plan to maintain his      present medications,      and I will see him back over the next 6 months, likely with a repeat      echocardiogram.  2. Otherwise, continue regular follow up with Dr. Su Hilt.                                Jonelle Sidle, MD    SGM/MedQ  DD:  04/09/2006  DT:  04/10/2006  Job #:  [id number]   cc:   Antony Madura, MD

## 2010-12-28 NOTE — Assessment & Plan Note (Signed)
[facility]                            CARDIOLOGY OFFICE NOTE   MARVON, SHILLINGBURG                      MRN:          [id number]  DATE:09/16/2006                            DOB:          03-16-1936    PRIMARY CARE PHYSICIAN:  Antony Madura, M.D.   REASON FOR VISIT:  Followup, premature ventricular complexes and  cardiomyopathy.   HISTORY OF PRESENT ILLNESS:  I saw Mr. Pineiro back in August.  His  history is detailed in my previous notes.  He continues to tolerate his  present medical regimen and is not complaining of any palpitations,  chest discomfort or limiting dyspnea.  He has had no syncope.  He has  previously documented mild left ventricular dysfunction with an inferior  apical wall motion abnormality, although only minimal coronary  irregularities at catheterization in January of last year.  His  electrocardiogram demonstrates sinus bradycardia at 53 beats per minute,  otherwise with no significant changes, compared to his prior tracing in  August.  He brought in a blood pressure record today showing fairly good  blood pressure control, except for a few systolics in the high 130s.   ALLERGIES:  NO KNOWN DRUG ALLERGIES.   PRESENT MEDICATIONS:  1. Aspirin 325 mg p.o. q.d.  2. Triamterine/hydrochlorothiazide 37.5/25 mg p.o. q.d.  3. Flomax 3.4 mg p.o. q.d.  4. Multivitamin 1 p.o. q.d.  5. Lovastatin 20 mg p.o. q.d.  6. Toprol XL 25 mg p.o. q.d.  7. Glucosamine 1500 mg p.o. q.d.  8. Saw palmetto 320 mg p.o. q.d.  9. Viagra 100 mg p.o. p.r.n.   REVIEW OF SYSTEMS:  As described in history of present illness.   His wife states that he is sometimes restless at night.   EXAMINATION:  VITAL SIGNS:  Blood pressure is 128/79 today, heart rate  54.  Weight is 198 pounds, which is stable.  Patient is comfortable and  in no acute distress.  HEENT:  Conjunctivae and lids normal, pharynx is clear.  NECK:  Supple without elevated jugular  venous pressure, without bruits.  No thyromegaly is noted.  LUNGS:  Clear without labored breathing at rest.  CARDIAC:  Regular rate and rhythm without ectopic beats.  No S3 gallop  or pericardial rub.  ABDOMEN:  Soft, no bruits.  Bowel sounds present.  EXTREMITIES:  Show no significant pitting edema and distal pulses are  2+.  SKIN:  Warm and dry.  MUSCULOSKELETAL:  No kyphosis is noted.  NEUROPSYCHIATRIC:  The patient is alert and oriented x3.  Affect is  normal.   IMPRESSION AND RECOMMENDATIONS:  1. History of previously documented frequent ventricular ectopy, now      significantly improved on beta blocker therapy.  Previous mild      cardiomyopathy was also demonstrated.  We will plan a followup      echocardiogram to reassess left ventricular function.  I will      otherwise plan a symptom review over the next 6 months.  2. Continue regular followup with Dr. Su Hilt.     Jonelle Sidle, MD  Electronically  Signed    SGM/MedQ  DD: 09/16/2006  DT: 09/16/2006  Job #: [id number]   cc:   Antony Madura, M.D.

## 2011-05-07 ENCOUNTER — Encounter: Payer: Self-pay | Admitting: Cardiovascular Disease

## 2011-05-08 ENCOUNTER — Encounter: Payer: Self-pay | Admitting: Cardiovascular Disease

## 2011-05-08 ENCOUNTER — Ambulatory Visit (INDEPENDENT_AMBULATORY_CARE_PROVIDER_SITE_OTHER): Payer: Medicare Other | Admitting: Cardiovascular Disease

## 2011-05-08 VITALS — BP 123/79 | HR 59 | Ht 73.0 in | Wt 195.8 lb

## 2011-05-08 DIAGNOSIS — I10 Essential (primary) hypertension: Secondary | ICD-10-CM

## 2011-05-08 DIAGNOSIS — R002 Palpitations: Secondary | ICD-10-CM

## 2011-05-08 NOTE — Assessment & Plan Note (Signed)
Stable No changes 

## 2011-05-08 NOTE — Progress Notes (Signed)
History of Present Illness:75 yo WM with history of HTN, HL, cardiomyopathy (resolved), BPH and palpitations here today for follow up.  Echo in September 2010 with normal LV function.   He has stopped his beta blocker secondary to fatigue and erectile dysfunction. At the last visit, he c/o leg pain with walking. ABI September 2011 normal. He tells me today that he has been feeling well. No chest pain, SOB, palpitations, dizziness, near syncope or syncope. Overall doing well. Occasional cramps in legs.   Primary care is Dr. Burton Apley.   Past Medical History  Diagnosis Date  . Hypertension   . Hyperlipidemia   . Other primary cardiomyopathies   . Palpitations   . Benign prostatic hypertrophy     history of  . ED (erectile dysfunction) of organic origin     history of    Past Surgical History  Procedure Date  . Cervical fusion     c5 to c7 posterior  . Colon resection 1995    5 inches removed    Current Outpatient Prescriptions  Medication Sig Dispense Refill  . amLODipine (NORVASC) 5 MG tablet Take 5 mg by mouth daily.        Marland Kitchen aspirin 81 MG tablet Take 81 mg by mouth daily.        . calcium carbonate (OS-CAL) 600 MG TABS Take 600 mg by mouth daily.        . Flaxseed, Linseed, (FLAXSEED OIL) 1000 MG CAPS Take by mouth 3 (three) times daily.        Marland Kitchen FLUZONE injection       . Glucosamine-Chondroit-Vit C-Mn (GLUCOSAMINE 1500 COMPLEX PO) Take by mouth daily.        Marland Kitchen lovastatin (MEVACOR) 20 MG tablet Take 20 mg by mouth at bedtime.        . multivitamin (THERAGRAN) tablet Take 1 tablet by mouth daily.        . predniSONE (DELTASONE) 20 MG tablet prn      . saw palmetto 160 MG capsule Take 160 mg by mouth 2 (two) times daily.        Marland Kitchen testosterone cypionate (DEPOTESTOTERONE CYPIONATE) 200 MG/ML injection       . vardenafil (LEVITRA) 20 MG tablet Take 20 mg by mouth daily as needed.        . vitamin C (ASCORBIC ACID) 500 MG tablet Take 500 mg by mouth daily.        .  methocarbamol (ROBAXIN) 750 MG tablet       . Tamsulosin HCl (FLOMAX) 0.4 MG CAPS         Allergies no known allergies  History   Social History  . Marital Status: Married    Spouse Name: N/A    Number of Children: N/A  . Years of Education: N/A   Occupational History  . retired     2003   Social History Main Topics  . Smoking status: Never Smoker   . Smokeless tobacco: Not on file  . Alcohol Use: No  . Drug Use: No  . Sexually Active: Not on file   Other Topics Concern  . Not on file   Social History Narrative  . No narrative on file    Family History  Problem Relation Age of Onset  . Coronary artery disease      family history  . Stroke Mother     also heart problems  . Kidney failure Father     also heart problems  .  Lung cancer Father     Review of Systems:  As stated in the HPI and otherwise negative.   BP 123/79  Pulse 59  Ht 6\' 1"  (1.854 m)  Wt 195 lb 12.8 oz (88.814 kg)  BMI 25.83 kg/m2  Physical Examination: General: Well developed, well nourished, NAD HEENT: OP clear, mucus membranes moist SKIN: warm, dry. No rashes. Neuro: No focal deficits Musculoskeletal: Muscle strength 5/5 all ext Psychiatric: Mood and affect normal Neck: No JVD, no carotid bruits, no thyromegaly, no lymphadenopathy. Lungs:Clear bilaterally, no wheezes, rhonci, crackles Cardiovascular: Regular rate and rhythm. No murmurs, gallops or rubs. Abdomen:Soft. Bowel sounds present. Non-tender.  Extremities: No lower extremity edema. Pulses are 2 + in the bilateral DP/PT.  QMV:HQION bradycardia, rate 58 bpm.

## 2011-05-08 NOTE — Assessment & Plan Note (Signed)
No awareness. Stable. No changes.

## 2011-05-08 NOTE — Patient Instructions (Signed)
Your physician wants you to follow-up in:  12 months.  You will receive a reminder letter in the mail two months in advance. If you don't receive a letter, please call our office to schedule the follow-up appointment.   

## 2011-09-29 ENCOUNTER — Ambulatory Visit (INDEPENDENT_AMBULATORY_CARE_PROVIDER_SITE_OTHER): Payer: Medicare Other | Admitting: Family Medicine

## 2011-09-29 ENCOUNTER — Ambulatory Visit: Payer: Medicare Other

## 2011-09-29 VITALS — BP 127/74 | HR 74 | Temp 97.1°F | Resp 16 | Ht 72.0 in | Wt 192.0 lb

## 2011-09-29 DIAGNOSIS — J189 Pneumonia, unspecified organism: Secondary | ICD-10-CM

## 2011-09-29 DIAGNOSIS — R05 Cough: Secondary | ICD-10-CM

## 2011-09-29 DIAGNOSIS — R059 Cough, unspecified: Secondary | ICD-10-CM

## 2011-09-29 MED ORDER — BENZONATATE 100 MG PO CAPS: ORAL_CAPSULE | ORAL | Status: DC

## 2011-09-29 MED ORDER — LEVOFLOXACIN 750 MG PO TABS: 750.0000 mg | ORAL_TABLET | Freq: Every day | ORAL | Status: AC

## 2011-09-29 NOTE — Patient Instructions (Signed)
Thank you for coming in today.  I appreciate your patience as we become more comfortable with our computer system.  Today you saw Genavieve Mangiapane, MD. I hope you feel better quickly. If you were not given printed prescriptions today, your medications have been sent to your specified pharmacy and can be picked up there.   

## 2011-09-29 NOTE — Progress Notes (Signed)
  Subjective:    Patient ID: Kevin Knight, male    DOB: [birth year], 76 y.o.   MRN: [id number]  HPI 76 yo male with htn, hld, and cardiomyopathy with URI symptoms for one month.  Has seen primary MD - "two shots" and keflex.  Still dry, hacking cough.  Head congestion.  No fevers.  Scratchy throat.   Briefly felt better, but then came back.  Doesn't know what shots were.  First given on same day as keflex (09/10/11).  Next shot was 1 week later.   Review of Systems Negative except as per HPI     Objective:   Physical Exam  Constitutional: He appears well-developed. No distress.  HENT:  Right Ear: Tympanic membrane, external ear and ear canal normal. Tympanic membrane is not injected, not scarred, not perforated, not erythematous, not retracted and not bulging.  Left Ear: Tympanic membrane, external ear and ear canal normal. Tympanic membrane is not injected, not scarred, not perforated, not erythematous, not retracted and not bulging.  Nose: No mucosal edema or rhinorrhea. Right sinus exhibits no maxillary sinus tenderness and no frontal sinus tenderness. Left sinus exhibits no maxillary sinus tenderness and no frontal sinus tenderness.  Mouth/Throat: Uvula is midline, oropharynx is clear and moist and mucous membranes are normal. No oropharyngeal exudate or tonsillar abscesses.  Cardiovascular: Normal rate, regular rhythm, normal heart sounds and intact distal pulses.   No murmur heard. Pulmonary/Chest: Effort normal and breath sounds normal. No respiratory distress. He has no wheezes. He has no rales.  Lymphadenopathy:       Head (right side): No submandibular and no preauricular adenopathy present.       Head (left side): No submandibular and no preauricular adenopathy present.       Right cervical: No superficial cervical and no posterior cervical adenopathy present.      Left cervical: No superficial cervical and no posterior cervical adenopathy present.       Right: No  supraclavicular adenopathy present.       Left: No supraclavicular adenopathy present.  Skin: Skin is warm and dry.   [facility] Primary radiology reading by Dr. Georgiana Shore: Distended colon, between liver and diaphragm (stable - noted in previous CXR's) Increased opacity/infiltrate LLL       Assessment & Plan:  Persistent cough, possible pneumonia Levaquin, benzonatate.  F/u with pcp.  Hold on steroids as suspect at least 1 of his 2 shots was a steroid.

## 2011-10-30 ENCOUNTER — Other Ambulatory Visit: Payer: Self-pay | Admitting: Internal Medicine

## 2011-10-30 ENCOUNTER — Ambulatory Visit
Admission: RE | Admit: 2011-10-30 | Discharge: 2011-10-30 | Disposition: A | Discharge status: Home / Self Care | Payer: Medicare Other | Source: Ambulatory Visit | Attending: Internal Medicine | Admitting: Internal Medicine

## 2011-10-30 DIAGNOSIS — J189 Pneumonia, unspecified organism: Secondary | ICD-10-CM

## 2012-01-15 HISTORY — PX: COLONOSCOPY: SHX174

## 2013-03-09 ENCOUNTER — Other Ambulatory Visit: Payer: Self-pay | Admitting: Orthopedic Surgery

## 2013-03-12 NOTE — Progress Notes (Signed)
Pt to be here 1130am-bring meds-and ins card

## 2013-03-17 NOTE — H&P (Signed)
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  He presents with his wife for evaluation and management of his finger predicament.  He is 6'1" tall and weighs 190 pounds.  He has not been using any medication for joint pain. He has no drug allergies.  Current medications include tamsulosin .4 mg. daily, lovastatin 20 mg. daily, triamterene 25 mg. daily, ibuprofen PRN, aspirin 81 mg. daily, glucosamine supplements and multivitamins.  He takes calcium, vitamin C and saw palmetto.  Background medical problems include chronic snoring,  prostatitis, generalized osteoarthritis.  Prior surgery, prostate surgery, cervical surgery and ophthalmologic surgery.   Social history reveals that he is married, he is a nonsmoker, he does not drink alcoholic beverages.  His family history is detailed and noncontributory.  14-point review of systems reveals corrective lenses, hearing impairment, high blood pressure controlled by medication.    Physical examination reveals a well appearing 77 year-old gentleman.  Inspection of his hand reveals Heberden's and Bouchard's nodes as well as thumb CMC arthrosis signs.  He has a recurrent mucoid cyst in the dorsoulnar aspect of his left long finger DIP joint.  He has palpable Heberden's and Bouchard's nodes.  He has full motion. There are no signs of stenosing tenosynovitis.  Motor and sensory examination is intact.    Four views of his long finger demonstrate a loose body in the dorsoradial aspect of his DIP joint and marginal osteophytes.  ASSESSMENT:   Underlying osteoarthritis with recurrent mucoid cyst right long finger.   PLAN:  We have advised him to proceed with DIP joint debridement under local anesthesia and sedation.  We will dbride his joint, irrigate the joint and remove his mucoid cyst.  This is typically successful  at a 95% confidence interval.    Kevin Knight., MD  H&P documentation: 03/18/2013  -History and Physical Reviewed  -Patient has been  re-examined  -No change in the plan of care  Kevin Forster, MD

## 2013-03-18 ENCOUNTER — Encounter (HOSPITAL_BASED_OUTPATIENT_CLINIC_OR_DEPARTMENT_OTHER): Payer: Self-pay

## 2013-03-18 ENCOUNTER — Ambulatory Visit (HOSPITAL_BASED_OUTPATIENT_CLINIC_OR_DEPARTMENT_OTHER)
Admission: RE | Admit: 2013-03-18 | Discharge: 2013-03-18 | Disposition: A | Discharge status: Home / Self Care | Payer: Medicare Other | Source: Ambulatory Visit | Attending: Orthopedic Surgery | Admitting: Orthopedic Surgery

## 2013-03-18 ENCOUNTER — Encounter (HOSPITAL_BASED_OUTPATIENT_CLINIC_OR_DEPARTMENT_OTHER)
Admission: RE | Disposition: A | Discharge status: Home / Self Care | Payer: Self-pay | Source: Ambulatory Visit | Attending: Orthopedic Surgery

## 2013-03-18 DIAGNOSIS — D2110 Benign neoplasm of connective and other soft tissue of unspecified upper limb, including shoulder: Secondary | ICD-10-CM | POA: Insufficient documentation

## 2013-03-18 DIAGNOSIS — R0989 Other specified symptoms and signs involving the circulatory and respiratory systems: Secondary | ICD-10-CM | POA: Insufficient documentation

## 2013-03-18 DIAGNOSIS — M159 Polyosteoarthritis, unspecified: Secondary | ICD-10-CM | POA: Insufficient documentation

## 2013-03-18 DIAGNOSIS — R0609 Other forms of dyspnea: Secondary | ICD-10-CM | POA: Insufficient documentation

## 2013-03-18 DIAGNOSIS — M2408 Loose body, other site: Secondary | ICD-10-CM | POA: Insufficient documentation

## 2013-03-18 DIAGNOSIS — H9190 Unspecified hearing loss, unspecified ear: Secondary | ICD-10-CM | POA: Insufficient documentation

## 2013-03-18 DIAGNOSIS — I10 Essential (primary) hypertension: Secondary | ICD-10-CM | POA: Insufficient documentation

## 2013-03-18 HISTORY — PX: LESION REMOVAL: SHX5196

## 2013-03-18 SURGERY — MINOR EXCISION OF LESION
Anesthesia: LOCAL | Site: Finger | Laterality: Left | Wound class: Clean

## 2013-03-18 MED ORDER — OXYCODONE-ACETAMINOPHEN 5-325 MG PO TABS: ORAL_TABLET | ORAL | Status: DC

## 2013-03-18 MED ORDER — CEPHALEXIN 500 MG PO CAPS: 500.0000 mg | ORAL_CAPSULE | Freq: Three times a day (TID) | ORAL | Status: DC

## 2013-03-18 MED ORDER — CHLORHEXIDINE GLUCONATE 4 % EX LIQD: 60.0000 mL | Freq: Once | CUTANEOUS | Status: DC

## 2013-03-18 MED ORDER — LIDOCAINE HCL 2 % IJ SOLN
INTRAMUSCULAR | Status: DC | PRN
  Administered 2013-03-18: 4.5 mL

## 2013-03-18 SURGICAL SUPPLY — 38 items
BANDAGE ADHESIVE 1X3 (GAUZE/BANDAGES/DRESSINGS) IMPLANT
BLADE SURG 15 STRL LF DISP TIS (BLADE) ×1 IMPLANT
BLADE SURG 15 STRL SS (BLADE) ×2
BNDG CMPR 9X4 STRL LF SNTH (GAUZE/BANDAGES/DRESSINGS)
BNDG CMPR MD 5X2 ELC HKLP STRL (GAUZE/BANDAGES/DRESSINGS)
BNDG COHESIVE 1X5 TAN STRL LF (GAUZE/BANDAGES/DRESSINGS) ×1 IMPLANT
BNDG ELASTIC 2 VLCR STRL LF (GAUZE/BANDAGES/DRESSINGS) IMPLANT
BNDG ESMARK 4X9 LF (GAUZE/BANDAGES/DRESSINGS) IMPLANT
BRUSH SCRUB EZ PLAIN DRY (MISCELLANEOUS) ×2 IMPLANT
CLOTH BEACON ORANGE TIMEOUT ST (SAFETY) ×2 IMPLANT
CORDS BIPOLAR (ELECTRODE) IMPLANT
COVER MAYO STAND STRL (DRAPES) ×2 IMPLANT
CUFF TOURNIQUET SINGLE 18IN (TOURNIQUET CUFF) IMPLANT
DECANTER SPIKE VIAL GLASS SM (MISCELLANEOUS) IMPLANT
DRAIN PENROSE 1/2X12 LTX STRL (WOUND CARE) ×1 IMPLANT
DRAPE SURG 17X23 STRL (DRAPES) ×1 IMPLANT
GAUZE SPONGE 4X4 12PLY STRL LF (GAUZE/BANDAGES/DRESSINGS) ×4 IMPLANT
GAUZE XEROFORM 1X8 LF (GAUZE/BANDAGES/DRESSINGS) ×1 IMPLANT
GLOVE BIO SURGEON STRL SZ7 (GLOVE) ×1 IMPLANT
GLOVE BIOGEL M STRL SZ7.5 (GLOVE) ×2 IMPLANT
GLOVE BIOGEL PI IND STRL 7.5 (GLOVE) IMPLANT
GLOVE BIOGEL PI INDICATOR 7.5 (GLOVE) ×1
GLOVE ORTHO TXT STRL SZ7.5 (GLOVE) ×2 IMPLANT
GOWN BRE IMP PREV XXLGXLNG (GOWN DISPOSABLE) ×3 IMPLANT
GOWN PREVENTION PLUS XLARGE (GOWN DISPOSABLE) ×2 IMPLANT
NEEDLE 27GAX1X1/2 (NEEDLE) ×1 IMPLANT
PACK BASIN DAY SURGERY FS (CUSTOM PROCEDURE TRAY) ×2 IMPLANT
PADDING CAST ABS 4INX4YD NS (CAST SUPPLIES) ×1
PADDING CAST ABS COTTON 4X4 ST (CAST SUPPLIES) ×1 IMPLANT
SPONGE GAUZE 4X4 12PLY (GAUZE/BANDAGES/DRESSINGS) ×2 IMPLANT
STOCKINETTE 4X48 STRL (DRAPES) ×2 IMPLANT
SUT ETHILON 5 0 P 3 18 (SUTURE) ×1
SUT NYLON ETHILON 5-0 P-3 1X18 (SUTURE) ×1 IMPLANT
SYR 3ML 23GX1 SAFETY (SYRINGE) IMPLANT
SYR CONTROL 10ML LL (SYRINGE) ×1 IMPLANT
TOWEL OR 17X24 6PK STRL BLUE (TOWEL DISPOSABLE) ×4 IMPLANT
TRAY DSU PREP LF (CUSTOM PROCEDURE TRAY) ×2 IMPLANT
UNDERPAD 30X30 INCONTINENT (UNDERPADS AND DIAPERS) ×1 IMPLANT

## 2013-03-18 NOTE — Op Note (Signed)
[id number] 

## 2013-03-18 NOTE — Brief Op Note (Signed)
03/18/2013  12:30 PM  PATIENT:  Kevin Knight  77 y.o. male  PRE-OPERATIVE DIAGNOSIS:  MUCOID CYST LEFT LONG and loose body in interphalangeal joint  POST-OPERATIVE DIAGNOSIS:  MUCOID CYST LEFT LONG and loose body in interphalangeal joint  PROCEDURE:  Procedure(s): DEBRIDEMENT LEFT LONG DISTAL INTERPHALANGEAL JOINT/DEBRIDEMENT DIP JOINT LOOSE BODIES LEFT LONG DIP (Left)  SURGEON:  Surgeon(s) and Role:    * Wyn Forster., MD - Primary  PHYSICIAN ASSISTANT:   ASSISTANTS: Mallory Shirk.A-C    ANESTHESIA:   local  EBL:     BLOOD ADMINISTERED:none  DRAINS: none   LOCAL MEDICATIONS USED:  XYLOCAINE   SPECIMEN:  No Specimen  DISPOSITION OF SPECIMEN:  N/A  COUNTS:  YES  TOURNIQUET:  * No tourniquets in log *  DICTATION: .Other Dictation: Dictation Number [id number]  PLAN OF CARE: Discharge to home after PACU  PATIENT DISPOSITION:  PACU - hemodynamically stable.   Delay start of Pharmacological VTE agent (>24hrs) due to surgical blood loss or risk of bleeding: not applicable

## 2013-03-19 ENCOUNTER — Encounter (HOSPITAL_BASED_OUTPATIENT_CLINIC_OR_DEPARTMENT_OTHER): Payer: Self-pay | Admitting: Orthopedic Surgery

## 2013-03-19 NOTE — Op Note (Signed)
[name], [name] NO.:  [id number]  MEDICAL RECORD NO.:  [id number]  LOCATION:                               FACILITY:  [facility]  PHYSICIAN:  Katy Fitch. Santez Woodcox, M.D. DATE OF BIRTH:  1936/02/02  DATE OF PROCEDURE:  03/18/2013 DATE OF DISCHARGE:  03/18/2013                              OPERATIVE REPORT   PREOPERATIVE DIAGNOSIS:  Recurrent mucoid cyst, dorsal ulnar aspect left long finger of distal interphalangeal joint with background severe degenerative arthritis of distal interphalangeal joint and a x-ray documented loose body in the dorsal radial aspect of the distal interphalangeal joint.  POSTOPERATIVE DIAGNOSIS:  Recurrent mucoid cyst, dorsal ulnar aspect left long finger of distal interphalangeal joint with background severe degenerative arthritis of distal interphalangeal joint and a x-ray documented loose body in the dorsal radial aspect of the distal interphalangeal joint.  OPERATION: 1. Excision of mucoid cyst from subcutaneous region of the left long     finger distal interphalangeal joint, dorsal ulnar aspect. 2. Through separate incisions, formal synovectomy and debridement of     loose bodies from left long finger DIP joint.  OPERATING SURGEON:  Katy Fitch. Len Kluver, MD.  ASSISTANT:  Marveen Reeks. Dasnoit, PA-C  ANESTHESIA:  2% lidocaine metacarpal head level block of left long finger.  ANESTHETIST:  Dr. Teressa Senter.  This was performed as a minor operating room procedure.  INDICATIONS:  Kevin Knight is a 77 year old gentleman referred through the courtesy of Dr. Zenaida Deed primary care physician for evaluation and management of a recurrent mucoid cyst on the dorsal ulnar aspect of his left long finger distal interphalangeal joint.  Kevin Knight has significant background osteoarthritis of the small joints of his hands.  He developed a mucoid cyst on the dorsal ulnar aspect of his left long finger that was debrided in the past by Dr. Su Hilt in  his office.  This recurred.  He is now referred for an upper extremity orthopedic consult.  Clinical examination, our office revealed a 8 mm in diameter recurrent mucoid cyst.  He had Heberden and Bouchard nodes evidencing significant osteoarthritis.  X-ray of the hand demonstrated extensive osteoarthritis of the metacarpophalangeal joints, PIP, and DIP joints.  There was noted to be a significant loose body on the dorsal radial aspect of the left long finger distal interphalangeal joint between the radial collateral ligament and extensor mechanism.  We advised Kevin Knight to consider debridement of his joint and repeat excision of his mucoid cyst.  We pointed out to him that most mucoid cyst are related to significant debris within the distal interphalangeal joint with background osteoarthritis.  Experience has proven that debridement of loose bodies and synovectomy can prevent recurrence.  After informed consent, he was brought to the operating room at this time.  PROCEDURE IN DETAIL:  Kevin Knight was interviewed in the holding area and his left long finger was marked as the proper surgical site following the surgical site identification protocol using a marking pen.  He was subsequently transferred to room #6 of the [facility] and placed in supine position on the operating table.   After informed consent and Betadine prep, 2% lidocaine was infiltrated at  metacarpal head level to obtain a digital block.  After 5 minutes excellent anesthesia was achieved.  The left hand and arm were then prepped with Betadine soap solution and sterilely draped.  A gauze wrap was placed on the left long finger followed by use of a half-inch Penrose drain over the proximal phalangeal segment as a digital tourniquet.  Following routine surgical time-out, the procedure commenced with checking for anesthesia.  Kevin Knight was fully anesthetic.  A curvilinear incision was fashioned  incorporating the cyst on the ulnar dorsal aspect of the DIP joint.  The cyst was circumferentially dissected and excised.  There was quite a bit of scar tissue on the dorsal ulnar aspect of the DIP joint from the prior procedure.  A triangle of scar was removed between the terminal extensor tendon slip and the ulnar collateral ligament.  A capsulotomy was performed, and a formal synovectomy of the dorsal and ulnar aspect of the DIP joint accomplished with a micro rongeur.  A micro curette was used to remove marginal osteophytes at the base of the distal phalanx.  Attention was then directed to the dorsal radial aspect were a separate incision was fashioned exposing the interval between the extensor tendon and the radial collateral ligament.  A triangular capsulotomy was performed, followed by formal synovectomy of the dorsal radial aspect of the DIP joint.  A microcurette and small rongeur were used to remove several loose bodies, and to remove osteophytes at the base of the distal phalanx.  We then used a 19-gauge blunt dental needle to irrigate the joint with saline.  The wounds were then repaired with trauma sutures of 5-0 nylon.  There were no apparent complications.  Aftercare, Kevin Knight was provided prescriptions for Keflex 5 mg 1 p.o. q.8 hours x4 days as a prophylactic antibiotic due to joint entry and also Percocet 5 mg 1 p.o. q.4-6 hours p.r.n. pain, #20 tablets without refill as a postoperative analgesic.     Katy Fitch Daksh Coates, M.D.     RVS/MEDQ  D:  03/18/2013  T:  03/19/2013  Job:  [id number]

## 2013-06-17 ENCOUNTER — Other Ambulatory Visit: Payer: Self-pay

## 2014-02-08 ENCOUNTER — Other Ambulatory Visit: Payer: Self-pay | Admitting: Internal Medicine

## 2014-02-08 ENCOUNTER — Ambulatory Visit
Admission: RE | Admit: 2014-02-08 | Discharge: 2014-02-08 | Disposition: A | Discharge status: Home / Self Care | Payer: Medicare Other | Source: Ambulatory Visit | Attending: Internal Medicine | Admitting: Internal Medicine

## 2014-02-08 DIAGNOSIS — R059 Cough, unspecified: Secondary | ICD-10-CM

## 2014-02-08 DIAGNOSIS — R0602 Shortness of breath: Secondary | ICD-10-CM

## 2014-02-08 DIAGNOSIS — R05 Cough: Secondary | ICD-10-CM

## 2014-05-12 DIAGNOSIS — Z23 Encounter for immunization: Secondary | ICD-10-CM

## 2014-05-12 HISTORY — DX: Encounter for immunization: Z23

## 2014-05-27 ENCOUNTER — Other Ambulatory Visit: Payer: Self-pay

## 2014-07-12 ENCOUNTER — Encounter: Payer: Self-pay | Admitting: Diagnostic Neuroimaging

## 2014-07-12 ENCOUNTER — Ambulatory Visit (INDEPENDENT_AMBULATORY_CARE_PROVIDER_SITE_OTHER): Payer: 59 | Admitting: Diagnostic Neuroimaging

## 2014-07-12 VITALS — BP 111/73 | HR 78 | Temp 97.5°F | Ht 73.0 in | Wt 189.0 lb

## 2014-07-12 DIAGNOSIS — R202 Paresthesia of skin: Secondary | ICD-10-CM

## 2014-07-12 DIAGNOSIS — M79644 Pain in right finger(s): Secondary | ICD-10-CM

## 2014-07-12 DIAGNOSIS — M25531 Pain in right wrist: Secondary | ICD-10-CM

## 2014-07-12 DIAGNOSIS — R200 Anesthesia of skin: Secondary | ICD-10-CM

## 2014-07-12 DIAGNOSIS — G252 Other specified forms of tremor: Secondary | ICD-10-CM

## 2014-07-12 DIAGNOSIS — G4752 REM sleep behavior disorder: Secondary | ICD-10-CM

## 2014-07-12 DIAGNOSIS — R259 Unspecified abnormal involuntary movements: Secondary | ICD-10-CM

## 2014-07-12 DIAGNOSIS — M62838 Other muscle spasm: Secondary | ICD-10-CM

## 2014-07-12 MED ORDER — CLONAZEPAM 0.5 MG PO TABS: 0.2500 mg | ORAL_TABLET | Freq: Every day | ORAL | Status: AC

## 2014-07-12 NOTE — Patient Instructions (Signed)
I will check MRI brain and neck, lab testing and electrical test of right hand/arm.  Try clonazepam 0.25-0.5mg  at bedtime for nighttime spasms/punching/kicking.

## 2014-07-12 NOTE — Progress Notes (Signed)
[facility]  PATIENT: Kevin Knight DOB: Dec 15, 1935  REFERRING CLINICIAN: K Kuzma HISTORY FROM: patient and wife  REASON FOR VISIT:  New consult    HISTORICAL  CHIEF COMPLAINT:  Chief Complaint  Patient presents with  . Neurologic Problem    spasm in nerves, jerking in sleep    HISTORY OF PRESENT ILLNESS:   78 year old ambidextrous male here for evaluation of muscle spasms in the right hand. The patient has several different types of symptoms.  Her symptoms patient describes his jerking when he is asleep.  Patient's wife reports clenching, kicking, gesturing, acting out dreams and sleep for past 10-12 years. No specific balance difficulty.  He has had reduced facial expressions recently. Mild intermittent tremors in bilateral upper extremities.  Second problem consist of electrical sensation in his right hand, especially with certain activities.  Sometimes if he grips objects or use his hand for one time his right thumb "locks up".  Patient also has intermittent "funny feeling" the for several years.  Patient has had poor sense of smell for many years.  The patient has remote right wrist fracture at age 19 years old.  History of neck surgery in the 2000's.  Patient has chronic insomnia and anxiety symptoms.    REVIEW OF SYSTEMS: Full 14 system review of systems performed and notable only for hearing loss joint pain or joint swelling cramps impotence sleepiness.  ALLERGIES: No Known Allergies  HOME MEDICATIONS: Outpatient Prescriptions Prior to Visit  Medication Sig Dispense Refill  . aspirin 81 MG tablet Take 81 mg by mouth daily.      . calcium carbonate (OS-CAL) 600 MG TABS Take 600 mg by mouth daily.      . Glucosamine-Chondroit-Vit C-Mn (GLUCOSAMINE 1500 COMPLEX PO) Take by mouth daily.      Marland Kitchen lovastatin (MEVACOR) 20 MG tablet Take 20 mg by mouth at bedtime.      . saw palmetto 160 MG capsule Take 160 mg by mouth 2 (two) times daily.      .  sildenafil (VIAGRA) 100 MG tablet Take 100 mg by mouth daily as needed for erectile dysfunction.    Marland Kitchen amLODipine (NORVASC) 5 MG tablet Take 5 mg by mouth daily.      . methocarbamol (ROBAXIN) 750 MG tablet     . testosterone cypionate (DEPOTESTOTERONE CYPIONATE) 200 MG/ML injection     . benzonatate (TESSALON) 100 MG capsule 1-2 po q 8 hrs prn cough 30 capsule 0  . cephALEXin (KEFLEX) 500 MG capsule Take 1 capsule (500 mg total) by mouth 3 (three) times daily. 12 capsule 0  . Flaxseed, Linseed, (FLAXSEED OIL) 1000 MG CAPS Take by mouth 3 (three) times daily.      Marland Kitchen FLUZONE injection     . multivitamin (THERAGRAN) tablet Take 1 tablet by mouth daily.      Marland Kitchen oxyCODONE-acetaminophen (PERCOCET/ROXICET) 5-325 MG per tablet Take one half to one tablet every 4-6 hours as needed for postoperative pain 20 tablet 0  . predniSONE (DELTASONE) 20 MG tablet prn    . Tamsulosin HCl (FLOMAX) 0.4 MG CAPS     . vardenafil (LEVITRA) 20 MG tablet Take 20 mg by mouth daily as needed.      . vitamin C (ASCORBIC ACID) 500 MG tablet Take 500 mg by mouth daily.       No facility-administered medications prior to visit.    PAST MEDICAL HISTORY: Past Medical History  Diagnosis Date  . Hypertension   . Hyperlipidemia   .  Other primary cardiomyopathies   . Palpitations   . Benign prostatic hypertrophy     history of  . ED (erectile dysfunction) of organic origin     history of  . Volvulus   . Prostate cancer     PAST SURGICAL HISTORY: Past Surgical History  Procedure Laterality Date  . Cervical fusion      c5 to c7 posterior  . Colon resection  1995    5 inches removed  . Partial colectomy    . Lesion removal Left 03/18/2013    Procedure: DEBRIDEMENT LEFT LONG DISTAL INTERPHALANGEAL JOINT/DEBRIDEMENT DIP JOINT LOOSE BODIES LEFT LONG DIP;  Surgeon: Cammie Sickle., MD;  Location: [place];  Service: Orthopedics;  Laterality: Left;  . Prostate surgery  2005    FAMILY  HISTORY: Family History  Problem Relation Age of Onset  . Coronary artery disease      family history  . Stroke Mother     also heart problems  . Kidney failure Father     also heart problems  . Lung cancer Father     SOCIAL HISTORY:  History   Social History  . Marital Status: Married    Spouse Name: Pamala Hurry    Number of Children: 2  . Years of Education: College    Occupational History  . retired     2003   Social History Main Topics  . Smoking status: Never Smoker   . Smokeless tobacco: Never Used  . Alcohol Use: No  . Drug Use: No  . Sexual Activity: Not on file   Other Topics Concern  . Not on file   Social History Narrative   Patient lives at home with his spouse.   Caffeine Use: 6 cups daily     PHYSICAL EXAM  Filed Vitals:   07/12/14 0824  BP: 111/73  Pulse: 78  Temp: 97.5 F (36.4 C)  TempSrc: Oral  Height: _0  (1.854 m)  Weight: 189 lb (85.73 kg)    Body mass index is 24.94 kg/(m^2).   Visual Acuity Screening   Right eye Left eye Both eyes  Without correction: 20/100 20/70   With correction:       No flowsheet data found.  GENERAL EXAM: Patient is in no distress; well developed, nourished and groomed; NECK IS STIFF, MILD STOOPED POSTURE  CARDIOVASCULAR: Regular rate and rhythm, no murmurs, no carotid bruits  NEUROLOGIC: MENTAL STATUS: awake, alert, oriented to person, place and time, recent and remote memory intact, normal attention and concentration, language fluent, comprehension intact, naming intact, fund of knowledge appropriate; MILD MOTOR APRAXIA CRANIAL NERVE: no papilledema on fundoscopic exam, pupils equal and reactive to light, visual fields full to confrontation, extraocular muscles intact, no nystagmus, facial sensation and strength symmetric, hearing intact, palate elevates symmetrically, uvula midline, shoulder shrug symmetric, tongue midline. MASKED FACIES. POSITIVE MYERSON AND SNOUT. POSITIVE ROOTING REFLEX ON  RIGHT. MOTOR: PARATONIA AND COGWHEELING IN BUE; POSTURAL, ACTION AND RESTING TREMOR IN BUE. BRADYKINESIA IN LUE > RUE. Full strength in the BUE, BLE SENSORY: ABSENT VIB AT TOES; DECR LT AND PP IN FEET. COORDINATION: finger-nose-finger, fine finger movements SLOW REFLEXES: BUE 3, KNEES 3, ANKLES TRACE GAIT/STATION: DECR ARM SWING, SHORT STEPS; UNSTEADY HEEL GAIT; CAN TAKE STEPS ON TOES AND TANDEM. PULL BACK TEST NEG. Romberg is negative    DIAGNOSTIC DATA (LABS, IMAGING, TESTING) - I reviewed patient records, labs, notes, testing and imaging myself where available.  Lab Results  Component Value Date  WBC 5.8 10/24/2008   HGB 13.9 10/24/2008   HCT 40.0 10/24/2008   MCV 88.0 10/24/2008   PLT 131* 10/24/2008      Component Value Date/Time   NA 138 10/24/2008 0815   K 4.8 10/24/2008 0815   CL 105 10/24/2008 0815   CO2 29 10/24/2008 0815   GLUCOSE 99 10/24/2008 0815   BUN 14 10/24/2008 0815   CREATININE 1.07 10/24/2008 0815   CALCIUM 9.1 10/24/2008 0815   PROT 6.1 10/24/2008 0815   ALBUMIN 3.5 10/24/2008 0815   AST 22 10/24/2008 0815   ALT 18 10/24/2008 0815   ALKPHOS 56 10/24/2008 0815   BILITOT 0.8 10/24/2008 0815   GFRNONAA >60 10/24/2008 0815   GFRAA  10/24/2008 0815    >60        The eGFR has been calculated using the MDRD equation. This calculation has not been validated in all clinical situations. eGFR's persistently <60 mL/min signify possible Chronic Kidney Disease.   No results found for: CHOL, HDL, LDLCALC, LDLDIRECT, TRIG, CHOLHDL No results found for: HGBA1C No results found for: VITAMINB12 No results found for: TSH  I reviewed images myself and agree with interpretation. -[facility]  09/17/05 Xray c-spine - Status post placement of anterior plate and screw fixation hardware and IBF plugs from C5 to C7.   ASSESSMENT AND PLAN  78 y.o. year old male here with multiple symptoms including tremor in upper services, bradykinesia and cogwheel rigidity, REM behavior  sleep disorder, insomnia, poor sense of smell, numbness in right hand, right thumb pain.  This may represent several different etiologies.  Suspect underlying Parkinson's disease.  In addition may have carpal tunnel syndrome versus cervical radiculopathy.  They also have a superimposed arthritis changes in right thumb.  Ddx: parkinson's disease + right wrist/thumb arthritis + carpal tunnel syndrome/radiculopathy  PLAN: - additional testing - trial of clonazepam for REM sleep disorder - may consider carbidopa/levodopa in future  Orders Placed This Encounter  Procedures  . MR Brain Wo Contrast  . MR Cervical Spine Wo Contrast  . Vitamin B12  . TSH  . Hemoglobin A1c   Meds ordered this encounter  Medications  . clonazePAM (KLONOPIN) 0.5 MG tablet    Sig: Take 0.5-1 tablets (0.25-0.5 mg total) by mouth at bedtime.    Dispense:  30 tablet    Refill:  3   Return in about 1 month (around 08/12/2014).  I reviewed images, labs, notes, records myself. I summarized findings and reviewed with patient, for this complex condition (new dx of parkinsonism, cervical spinal stenosis vs radiculopathy, hyperreflexia, right arm/hand pain, REM sleep disorder) requiring high complexity decision making.    Penni Bombard, MD 13/0/8657, 8:46 AM Certified in Neurology, Neurophysiology and Neuroimaging  [facility] [address], [place], [place] [address] [phone]

## 2014-07-29 ENCOUNTER — Encounter: Payer: Self-pay | Admitting: Diagnostic Neuroimaging

## 2014-08-01 ENCOUNTER — Encounter: Payer: Self-pay | Admitting: *Deleted

## 2014-08-01 ENCOUNTER — Ambulatory Visit
Admission: RE | Admit: 2014-08-01 | Discharge: 2014-08-01 | Disposition: A | Discharge status: Home / Self Care | Payer: Medicare Other | Source: Ambulatory Visit | Attending: Diagnostic Neuroimaging | Admitting: Diagnostic Neuroimaging

## 2014-08-01 DIAGNOSIS — R202 Paresthesia of skin: Secondary | ICD-10-CM

## 2014-08-01 DIAGNOSIS — R200 Anesthesia of skin: Secondary | ICD-10-CM

## 2014-08-01 DIAGNOSIS — M62838 Other muscle spasm: Secondary | ICD-10-CM

## 2014-08-01 DIAGNOSIS — G4752 REM sleep behavior disorder: Secondary | ICD-10-CM

## 2014-08-01 DIAGNOSIS — R259 Unspecified abnormal involuntary movements: Secondary | ICD-10-CM

## 2014-08-01 DIAGNOSIS — M25531 Pain in right wrist: Secondary | ICD-10-CM

## 2014-08-01 DIAGNOSIS — G252 Other specified forms of tremor: Secondary | ICD-10-CM

## 2014-08-01 DIAGNOSIS — M79644 Pain in right finger(s): Secondary | ICD-10-CM

## 2014-08-06 ENCOUNTER — Ambulatory Visit (INDEPENDENT_AMBULATORY_CARE_PROVIDER_SITE_OTHER): Payer: 59 | Admitting: Family Medicine

## 2014-08-06 VITALS — BP 118/68 | HR 78 | Temp 97.7°F | Resp 17 | Ht 71.5 in | Wt 193.0 lb

## 2014-08-06 DIAGNOSIS — R519 Headache, unspecified: Secondary | ICD-10-CM

## 2014-08-06 DIAGNOSIS — R51 Headache: Secondary | ICD-10-CM

## 2014-08-06 DIAGNOSIS — B029 Zoster without complications: Secondary | ICD-10-CM

## 2014-08-06 MED ORDER — VALACYCLOVIR HCL 1 G PO TABS
1000.0000 mg | ORAL_TABLET | Freq: Three times a day (TID) | ORAL | Status: DC
Start: 2014-08-06 — End: 2015-04-30

## 2014-08-06 MED ORDER — TRAMADOL HCL 50 MG PO TABS
50.0000 mg | ORAL_TABLET | Freq: Three times a day (TID) | ORAL | Status: DC | PRN
Start: 2014-08-06 — End: 2015-04-30

## 2014-08-06 NOTE — Patient Instructions (Signed)
Take the tramadol 1 every 6-8 hours if needed for more severe pain. Take Tylenol for milder pain. If this does not give sufficient pain relief contact me for a stronger pain medication, but I prefer not to give the real strong medicines in someone your age because they can cause confusion or constipation.  Take the valacyclovir 1000 mg 3 times daily for shingles for one week  The rash may take a couple of weeks to resolve, sometimes leave some scarring.  Shingles Shingles (herpes zoster) is an infection that is caused by the same virus that causes chickenpox (varicella). The infection causes a painful skin rash and fluid-filled blisters, which eventually break open, crust over, and heal. It may occur in any area of the body, but it usually affects only one side of the body or face. The pain of shingles usually lasts about 1 month. However, some people with shingles may develop long-term (chronic) pain in the affected area of the body. Shingles often occurs many years after the person had chickenpox. It is more common:  In people older than 50 years.  In people with weakened immune systems, such as those with HIV, AIDS, or cancer.  In people taking medicines that weaken the immune system, such as transplant medicines.  In people under great stress. CAUSES  Shingles is caused by the varicella zoster virus (VZV), which also causes chickenpox. After a person is infected with the virus, it can remain in the person's body for years in an inactive state (dormant). To cause shingles, the virus reactivates and breaks out as an infection in a nerve root. The virus can be spread from person to person (contagious) through contact with open blisters of the shingles rash. It will only spread to people who have not had chickenpox. When these people are exposed to the virus, they may develop chickenpox. They will not develop shingles. Once the blisters scab over, the person is no longer contagious and cannot  spread the virus to others. SIGNS AND SYMPTOMS  Shingles shows up in stages. The initial symptoms may be pain, itching, and tingling in an area of the skin. This pain is usually described as burning, stabbing, or throbbing.In a few days or weeks, a painful red rash will appear in the area where the pain, itching, and tingling were felt. The rash is usually on one side of the body in a band or belt-like pattern. Then, the rash usually turns into fluid-filled blisters. They will scab over and dry up in approximately 2-3 weeks. Flu-like symptoms may also occur with the initial symptoms, the rash, or the blisters. These may include:  Fever.  Chills.  Headache.  Upset stomach. DIAGNOSIS  Your health care provider will perform a skin exam to diagnose shingles. Skin scrapings or fluid samples may also be taken from the blisters. This sample will be examined under a microscope or sent to a lab for further testing. TREATMENT  There is no specific cure for shingles. Your health care provider will likely prescribe medicines to help you manage the pain, recover faster, and avoid long-term problems. This may include antiviral drugs, anti-inflammatory drugs, and pain medicines. HOME CARE INSTRUCTIONS   Take a cool bath or apply cool compresses to the area of the rash or blisters as directed. This may help with the pain and itching.   Take medicines only as directed by your health care provider.   Rest as directed by your health care provider.  Keep your rash and blisters clean  with mild soap and cool water or as directed by your health care provider.  Do not pick your blisters or scratch your rash. Apply an anti-itch cream or numbing creams to the affected area as directed by your health care provider.  Keep your shingles rash covered with a loose bandage (dressing).  Avoid skin contact with:  Babies.   Pregnant women.   Children with eczema.   Elderly people with transplants.    People with chronic illnesses, such as leukemia or AIDS.   Wear loose-fitting clothing to help ease the pain of material rubbing against the rash.  Keep all follow-up visits as directed by your health care provider.If the area involved is on your face, you may receive a referral for a specialist, such as an eye doctor (ophthalmologist) or an ear, nose, and throat (ENT) doctor. Keeping all follow-up visits will help you avoid eye problems, chronic pain, or disability.  SEEK IMMEDIATE MEDICAL CARE IF:   You have facial pain, pain around the eye area, or loss of feeling on one side of your face.  You have ear pain or ringing in your ear.  You have loss of taste.  Your pain is not relieved with prescribed medicines.   Your redness or swelling spreads.   You have more pain and swelling.  Your condition is worsening or has changed.   You have a fever. MAKE SURE YOU:  Understand these instructions.  Will watch your condition.  Will get help right away if you are not doing well or get worse. Document Released: 07/29/2005 Document Revised: 12/13/2013 Document Reviewed: 03/12/2012 [facility] Patient Information 2015 [place], [place]. This information is not intended to replace advice given to you by your health care provider. Make sure you discuss any questions you have with your health care provider.

## 2014-08-06 NOTE — Progress Notes (Signed)
Subjective: 78 year old man who is been having problems with a rash on his right forehead for the last week. It itches and hurts. Last night was a reference night for him, with difficulty sleeping do to it. He has a history of deafness, so w of the right eye since he was a child. He has not had any change in symptoms with this rash. 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  Objective: He has a scabbed rash primarily in a cluster at the right temple and lateral area of the forehead. He has one just lateral to the right eyebrow and one just medial to the right eyebrow.  Assessment: Shingles right forehead, ophthalmic branch.  Plan: This seems to be on the resolving phase at this time. I think he will not have any concerns with the eye, though cautioned him if any problems arise he needs to come get checked.

## 2014-08-13 ENCOUNTER — Other Ambulatory Visit: Payer: Self-pay | Admitting: Family Medicine

## 2014-08-15 NOTE — Telephone Encounter (Signed)
Dr Linna Darner, do you want to RF, or need to re-check pt?

## 2014-08-16 ENCOUNTER — Encounter: Payer: Self-pay | Admitting: Diagnostic Neuroimaging

## 2014-08-16 ENCOUNTER — Ambulatory Visit (INDEPENDENT_AMBULATORY_CARE_PROVIDER_SITE_OTHER): Payer: 59 | Admitting: Diagnostic Neuroimaging

## 2014-08-16 VITALS — BP 114/65 | HR 69 | Temp 97.4°F | Ht 71.0 in | Wt 192.0 lb

## 2014-08-16 DIAGNOSIS — G4752 REM sleep behavior disorder: Secondary | ICD-10-CM

## 2014-08-16 NOTE — Patient Instructions (Signed)
Continue clonazepam. 

## 2014-08-16 NOTE — Progress Notes (Signed)
[facility]  PATIENT: Kevin Knight DOB: [birth year]  REFERRING CLINICIAN: K Kuzma HISTORY FROM: patient and wife  REASON FOR VISIT:  Follow up   HISTORICAL  CHIEF COMPLAINT:  Chief Complaint  Patient presents with  . Follow-up    .    HISTORY OF PRESENT ILLNESS:   UPDATE 08/16/14: Since last visit, clonazepam has greatly helped his night-time punching/kicking/restless feelings. MRI results reviewed. Also, he had right V1 shingles outbreak 2 weeks ago. Has had shingles vaccine 8 years ago.  UPDATE 07/12/14 (VRP): 79 year old ambidextrous male here for evaluation of muscle spasms in the right hand. The patient has several different types of symptoms.  Her symptoms patient describes his jerking when he is asleep.  Patient's wife reports clenching, kicking, gesturing, acting out dreams and sleep for past 10-12 years. No specific balance difficulty.  He has had reduced facial expressions recently. Mild intermittent tremors in bilateral upper extremities. Second problem consist of electrical sensation in his right hand, especially with certain activities.  Sometimes if he grips objects or use his hand for one time his right thumb "locks up". Patient also has intermittent "funny feeling" the for several years. Patient has had poor sense of smell for many years. The patient has remote right wrist fracture at age 13 years old. History of neck surgery in the 2000's. Patient has chronic insomnia and anxiety symptoms.   REVIEW OF SYSTEMS: Full 14 system review of systems performed and notable only for eye redness.    ALLERGIES: No Known Allergies  HOME MEDICATIONS: Outpatient Prescriptions Prior to Visit  Medication Sig Dispense Refill  . amLODipine (NORVASC) 10 MG tablet Take 1 tablet by mouth daily.  3  . Ascorbic Acid (VITAMIN C PO) Take 2,000 mg by mouth daily.    Marland Kitchen aspirin 81 MG tablet Take 81 mg by mouth daily.      . calcium carbonate (OS-CAL) 600 MG TABS Take 600  mg by mouth daily.      . Cholecalciferol (VITAMIN D3) 2000 UNITS TABS Take by mouth daily.    . clonazePAM (KLONOPIN) 0.5 MG tablet Take 0.5-1 tablets (0.25-0.5 mg total) by mouth at bedtime. 30 tablet 3  . Glucosamine-Chondroit-Vit C-Mn (GLUCOSAMINE 1500 COMPLEX PO) Take by mouth daily.      Marland Kitchen lovastatin (MEVACOR) 20 MG tablet Take 20 mg by mouth at bedtime.      . methocarbamol (ROBAXIN) 750 MG tablet     . Multiple Vitamins-Minerals (MULTIVITAMIN ADULTS 50+) TABS Take 1 tablet by mouth daily.    . saw palmetto 160 MG capsule Take 160 mg by mouth 2 (two) times daily.      . sildenafil (VIAGRA) 100 MG tablet Take 100 mg by mouth daily as needed for erectile dysfunction.    Marland Kitchen testosterone cypionate (DEPOTESTOTERONE CYPIONATE) 200 MG/ML injection     . traMADol (ULTRAM) 50 MG tablet Take 1 tablet (50 mg total) by mouth every 8 (eight) hours as needed. 20 tablet 0  . valACYclovir (VALTREX) 1000 MG tablet Take 1 tablet (1,000 mg total) by mouth 3 (three) times daily. 21 tablet 0   No facility-administered medications prior to visit.    PAST MEDICAL HISTORY: Past Medical History  Diagnosis Date  . Hypertension   . Hyperlipidemia   . Other primary cardiomyopathies   . Palpitations   . Benign prostatic hypertrophy     history of  . ED (erectile dysfunction) of organic origin     history of  . Volvulus   .  Prostate cancer   . 23-polyvalent pneumococcal polysaccharide vaccine contraindicated as received shingles vaccine within last 4 weeks 2008    SHINGLES SHOT IN 2008  . Vaccine for streptococcus pneumoniae and influenza 05/12/14    FLUE SHOT 0N 05/12/14 and PNEUMONIA 0N 05/13/14  . Arthritis   . Heart murmur   . Neuromuscular disorder     PAST SURGICAL HISTORY: Past Surgical History  Procedure Laterality Date  . Cervical fusion      c5 to c7 posterior  . Colon resection  1995    5 inches removed  . Partial colectomy    . Lesion removal Left 03/18/2013    Procedure: DEBRIDEMENT  LEFT LONG DISTAL INTERPHALANGEAL JOINT/DEBRIDEMENT DIP JOINT LOOSE BODIES LEFT LONG DIP;  Surgeon: Cammie Sickle., MD;  Location: [facility];  Service: Orthopedics;  Laterality: Left;  . Prostate surgery  2005  . Colonoscopy  01/15/12  . Eye surgery    . Small intestine surgery      FAMILY HISTORY: Family History  Problem Relation Age of Onset  . Coronary artery disease      family history  . Stroke Mother     also heart problems  . Kidney failure Father     also heart problems  . Lung cancer Father     SOCIAL HISTORY:  History   Social History  . Marital Status: Married    Spouse Name: Pamala Hurry    Number of Children: 2  . Years of Education: College    Occupational History  . retired     2003   Social History Main Topics  . Smoking status: Never Smoker   . Smokeless tobacco: Never Used  . Alcohol Use: No  . Drug Use: No  . Sexual Activity: No   Other Topics Concern  . Not on file   Social History Narrative   Patient lives at home with his spouse.   Caffeine Use: 6 cups daily     PHYSICAL EXAM  Filed Vitals:   08/16/14 1356  BP: 114/65  Pulse: 69  Temp: 97.4 F (36.3 C)  TempSrc: Oral  Height: _0  (1.803 m)  Weight: 192 lb (87.091 kg)    Body mass index is 26.79 kg/(m^2).  No exam data present  No flowsheet data found.  GENERAL EXAM: Patient is in no distress; well developed, nourished and groomed; NECK IS STIFF, MILD STOOPED POSTURE  CARDIOVASCULAR: Regular rate and rhythm, no murmurs, no carotid bruits  NEUROLOGIC: MENTAL STATUS: awake, alert, language fluent, comprehension intact, naming intact, fund of knowledge appropriate; MILD MOTOR APRAXIA CRANIAL NERVE: pupils equal and reactive to light, visual fields full to confrontation, extraocular muscles intact, no nystagmus, facial sensation and strength symmetric, hearing intact, palate elevates symmetrically, uvula midline, shoulder shrug symmetric, tongue midline.  MASKED FACIES. POSITIVE MYERSON AND SNOUT. POSITIVE ROOTING REFLEX ON RIGHT. MOTOR: PARATONIA AND COGWHEELING IN BUE; POSTURAL, ACTION AND RESTING TREMOR IN BUE. BRADYKINESIA IN LUE > RUE. Full strength in the BUE, BLE SENSORY: ABSENT VIB AT TOES; DECR LT AND PP IN FEET. COORDINATION: finger-nose-finger, fine finger movements SLOW REFLEXES: BUE 3, KNEES 3, ANKLES TRACE GAIT/STATION: DECR ARM SWING, SHORT STEPS; UNSTEADY HEEL GAIT; CAN TAKE STEPS ON TOES AND TANDEM. PULL BACK TEST NEG. Romberg is negative    DIAGNOSTIC DATA (LABS, IMAGING, TESTING) - I reviewed patient records, labs, notes, testing and imaging myself where available.  Lab Results  Component Value Date   WBC 5.8 10/24/2008   HGB  13.9 10/24/2008   HCT 40.0 10/24/2008   MCV 88.0 10/24/2008   PLT 131* 10/24/2008      Component Value Date/Time   NA 138 10/24/2008 0815   K 4.8 10/24/2008 0815   CL 105 10/24/2008 0815   CO2 29 10/24/2008 0815   GLUCOSE 99 10/24/2008 0815   BUN 14 10/24/2008 0815   CREATININE 1.07 10/24/2008 0815   CALCIUM 9.1 10/24/2008 0815   PROT 6.1 10/24/2008 0815   ALBUMIN 3.5 10/24/2008 0815   AST 22 10/24/2008 0815   ALT 18 10/24/2008 0815   ALKPHOS 56 10/24/2008 0815   BILITOT 0.8 10/24/2008 0815   GFRNONAA >60 10/24/2008 0815   GFRAA  10/24/2008 0815    >60        The eGFR has been calculated using the MDRD equation. This calculation has not been validated in all clinical situations. eGFR's persistently <60 mL/min signify possible Chronic Kidney Disease.   No results found for: CHOL, HDL, LDLCALC, LDLDIRECT, TRIG, CHOLHDL No results found for: HGBA1C No results found for: VITAMINB12 No results found for: TSH  I reviewed images myself and agree with interpretation. -VRP  09/17/05 Xray c-spine - Status post placement of anterior plate and screw fixation hardware and IBF plugs from C5 to C7.  08/02/14 MRI brain (without) demonstrating: 1. 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  2. Right posterior fossa arachnoid cyst posterior to the cerebellum (2.0x1.3cm).  3. Mild periventricular and subcortical foci of non-specific gliosis. 4. No acute findings.   08/02/14 MRI cervical spine (without) demonstrating: 1. Anterior discectomy and fusion from C5-C7 with metal hardware. 2. Multi-level uncovertebral joint hypertrophy and facet hypertrophy with multi-level foraminal stenosis (C2-3 to C6-7) as noted above.    ASSESSMENT AND PLAN  79y.o. year old male here with multiple symptoms including tremor in upper services, bradykinesia and cogwheel rigidity, REM behavior sleep disorder, insomnia, poor sense of smell, numbness in right hand, right thumb pain.  This may represent several different etiologies. Suspect underlying Parkinson's disease.  In addition may have carpal tunnel syndrome versus cervical radiculopathy.  They also have a superimposed arthritis changes in right thumb.  Ddx: REM sleep disorder + parkinsonism + right wrist/thumb arthritis + carpal tunnel syndrome  PLAN: - continue clonazepam for REM sleep disorder - may consider carbidopa/levodopa in future  Return in about 4 months (around 12/15/2014).   VPenni Bombard MD 14/02/9986 22:15PM Certified in Neurology, Neurophysiology and Neuroimaging  [facility] [address] [place] [phone]

## 2015-02-06 ENCOUNTER — Other Ambulatory Visit: Payer: Self-pay

## 2015-02-14 ENCOUNTER — Ambulatory Visit: Payer: 59 | Admitting: Diagnostic Neuroimaging

## 2015-04-30 ENCOUNTER — Ambulatory Visit (INDEPENDENT_AMBULATORY_CARE_PROVIDER_SITE_OTHER): Payer: Medicare Other | Admitting: Family Medicine

## 2015-04-30 VITALS — BP 110/70 | HR 74 | Temp 97.8°F | Resp 17 | Ht 71.5 in | Wt 182.4 lb

## 2015-04-30 DIAGNOSIS — M1811 Unilateral primary osteoarthritis of first carpometacarpal joint, right hand: Secondary | ICD-10-CM

## 2015-04-30 DIAGNOSIS — H6120 Impacted cerumen, unspecified ear: Secondary | ICD-10-CM | POA: Diagnosis not present

## 2015-04-30 DIAGNOSIS — R21 Rash and other nonspecific skin eruption: Secondary | ICD-10-CM | POA: Diagnosis not present

## 2015-04-30 DIAGNOSIS — M19041 Primary osteoarthritis, right hand: Secondary | ICD-10-CM | POA: Diagnosis not present

## 2015-04-30 MED ORDER — MUPIROCIN 2 % EX OINT: 1.0000 "application " | TOPICAL_OINTMENT | Freq: Two times a day (BID) | CUTANEOUS | Status: DC

## 2015-04-30 NOTE — Progress Notes (Signed)
@UMFCLOGO @  This chart was scribed for Robyn Haber, MD by Thea Alken, ED Scribe. This patient was seen in room 2 and the patient's care was started at 11:45 AM.  Patient ID: EYOB GODLEWSKI MRN: [id number], DOB: 06-14-1936, 79 y.o. Date of Encounter: 04/30/2015, 11:38 AM  Primary Physician: Myriam Jacobson, MD  Chief Complaint:  Chief Complaint  Patient presents with  . Rash    below left eye and on stomach-noticed about a week ago. Tried using Cortisone cream (hasnt cleared up yet)  . Cerumen Impaction    C/Obilateral wax impaction- wife unable to clear it out    HPI: 79 y.o. year old male with history below presents with erythematous rash to face and abdomen for 1 week. Pt believes this may be recurrent shingles. He had shingles on his face 2 years ago. He has tried cortisone cream without relief to symptoms. 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   He also has some gradually worsening, right wrist pain and swelling. He has had some locking in wrist, worse when doing yard work. He has not been seen by orthopedist. He declines a referral at this time and is planning to go out of town in a couple of weeks.    Pt also c/o cerumen in bilateral ears. Pt wears hearing aids.  Pt is retired but stays active.   Past Medical History  Diagnosis Date  . Hypertension   . Hyperlipidemia   . Other primary cardiomyopathies   . Palpitations   . Benign prostatic hypertrophy     history of  . ED (erectile dysfunction) of organic origin     history of  . Volvulus   . Prostate cancer   . 23-polyvalent pneumococcal polysaccharide vaccine contraindicated as received shingles vaccine within last 4 weeks 2008    SHINGLES SHOT IN 2008  . Vaccine for streptococcus pneumoniae and influenza 05/12/14    FLUE SHOT 0N 05/12/14 and PNEUMONIA 0N 05/13/14  . Arthritis   . Heart murmur   . Neuromuscular disorder      Home Meds: Prior to Admission medications    Medication Sig Start Date End Date Taking? Authorizing Provider  amLODipine (NORVASC) 10 MG tablet Take 1 tablet by mouth daily. 06/25/14  Yes Historical Provider, MD  Ascorbic Acid (VITAMIN C PO) Take 2,000 mg by mouth daily.   Yes Historical Provider, MD  aspirin 81 MG tablet Take 81 mg by mouth daily.     Yes Historical Provider, MD  calcium carbonate (OS-CAL) 600 MG TABS Take 600 mg by mouth daily.     Yes Historical Provider, MD  Cholecalciferol (VITAMIN D3) 2000 UNITS TABS Take by mouth daily.   Yes Historical Provider, MD  clonazePAM (KLONOPIN) 0.5 MG tablet Take 0.5-1 tablets (0.25-0.5 mg total) by mouth at bedtime. 07/12/14  Yes Penni Bombard, MD  Glucosamine-Chondroit-Vit C-Mn (GLUCOSAMINE 1500 COMPLEX PO) Take by mouth daily.     Yes Historical Provider, MD  lovastatin (MEVACOR) 20 MG tablet Take 20 mg by mouth at bedtime.     Yes Historical Provider, MD  methocarbamol (ROBAXIN) 750 MG tablet  02/06/11  Yes Historical Provider, MD  Multiple Vitamins-Minerals (MULTIVITAMIN ADULTS 50+) TABS Take 1 tablet by mouth daily.   Yes Historical Provider, MD  saw palmetto 160 MG capsule Take 160 mg by mouth 2 (two) times daily.     Yes Historical Provider, MD  sildenafil (VIAGRA) 100 MG tablet Take 100 mg  by mouth daily as needed for erectile dysfunction.   Yes Historical Provider, MD  testosterone cypionate (DEPOTESTOTERONE CYPIONATE) 200 MG/ML injection  02/06/11   Historical Provider, MD    Allergies: No Known Allergies  Social History   Social History  . Marital Status: Married    Spouse Name: Pamala Hurry  . Number of Children: 2  . Years of Education: College    Occupational History  . retired     2003   Social History Main Topics  . Smoking status: Never Smoker   . Smokeless tobacco: Never Used  . Alcohol Use: No  . Drug Use: No  . Sexual Activity: No   Other Topics Concern  . Not on file   Social History Narrative   Patient lives at home with his spouse.   Caffeine  Use: 6 cups daily     Review of Systems: Constitutional: negative for chills, fever, night sweats, weight changes, or fatigue  HEENT: negative for vision changes, hearing loss, congestion, rhinorrhea, ST, epistaxis, or sinus pressure Cardiovascular: negative for chest pain or palpitations Respiratory: negative for hemoptysis, wheezing, shortness of breath, or cough Abdominal: negative for abdominal pain, nausea, vomiting, diarrhea, or constipation Dermatological: negative for rash Neurologic: negative for headache, dizziness, or syncope All other systems reviewed and are otherwise negative with the exception to those above and in the HPI.   Physical Exam: Blood pressure 110/70, pulse 74, temperature 97.8 F (36.6 C), temperature source Oral, resp. rate 17, height 5' 11.5" (1.816 m), weight 182 lb 6 oz (82.725 kg), SpO2 98 %., Body mass index is 25.08 kg/(m^2). General: Well developed, well nourished, in no acute distress. Head: Normocephalic, atraumatic, eyes without discharge, sclera non-icteric, nares are without discharge. Bilateral auditory canals clear, TM's are without perforation, pearly grey and translucent with reflective cone of light bilaterally. Small amount of wax build up in each ear. Dullness of the TM. He was also wearing hearing aids.Oral cavity moist, posterior pharynx without exudate, erythema, peritonsillar abscess, or post nasal drip.  Neck: Supple. No thyromegaly. Full ROM. No lymphadenopathy. Lungs: Clear bilaterally to auscultation without wheezes, rales, or rhonchi. Breathing is unlabored. Heart: RRR with S1 S2. No murmurs, rubs, or gallops appreciated. Abdomen: Soft, non-tender, non-distended with normoactive bowel sounds. No hepatomegaly. No rebound/guarding. No obvious abdominal masses. Msk:  Strength and tone normal for age. Right Wrist- Prominent swelling moderately firm at the base of the right thumb Extremities/Skin: Warm and dry. No clubbing or cyanosis. No  edema. No rashes or suspicious lesions. Neuro: Alert and oriented X 3. Moves all extremities spontaneously. Gait is normal. CNII-XII grossly except for a right exophoria Psych:  Responds to questions appropriately with a normal affect. Skin: vesicular eruption of fine papules below left eye and right side of the umbilicus     ASSESSMENT AND PLAN:  80 y.o. year old male with minimal cerumen impaction, right thumb swelling and discomfort, and the left eye and abdominal rash. I reassured the patient that there was not enough of a rash with the type of a rash to qualify as shingles. Instead, it looks like a very mild localized infected skin without induration or cellulitis.  I suspect the right thumb swelling is at least in part due to synovial thickening and possibly a ganglion cyst. I suggest the patient that an orthopedic referral would be helpful should he request this. Finally, the patient has minimal cerumen but this was lavaged and hopefully will make him more comfortable.  This chart was scribed  in my presence and reviewed by me personally.    ICD-9-CM ICD-10-CM   1. Rash and nonspecific skin eruption 782.1 R21 mupirocin ointment (BACTROBAN) 2 %  2. Cerumen impaction, unspecified laterality 380.4 H61.20   3. Degenerative arthritis of thumb, right 715.34 M19.041      By signing my name below, I, Raven Small, attest that this documentation has been prepared under the direction and in the presence of Robyn Haber, MD.  Electronically Signed: Thea Alken, ED Scribe. 04/30/2015. 11:47 AM.  Signed, Robyn Haber, MD 04/30/2015 11:38 AM

## 2015-04-30 NOTE — Patient Instructions (Signed)
Dr. Roseanne Kaufman is excellent orthopedist for the right thumb swelling

## 2015-12-25 ENCOUNTER — Ambulatory Visit
Admission: RE | Admit: 2015-12-25 | Discharge: 2015-12-25 | Disposition: A | Discharge status: Home / Self Care | Payer: Medicare Other | Source: Ambulatory Visit | Attending: Internal Medicine | Admitting: Internal Medicine

## 2015-12-25 ENCOUNTER — Other Ambulatory Visit: Payer: Self-pay | Admitting: Internal Medicine

## 2015-12-25 DIAGNOSIS — R0781 Pleurodynia: Secondary | ICD-10-CM

## 2017-08-25 ENCOUNTER — Encounter: Payer: Self-pay | Admitting: Cardiovascular Disease

## 2017-08-25 ENCOUNTER — Ambulatory Visit: Payer: Medicare Other | Admitting: Cardiovascular Disease

## 2017-08-25 VITALS — BP 104/70 | HR 68 | Ht 71.5 in | Wt 185.5 lb

## 2017-08-25 DIAGNOSIS — I10 Essential (primary) hypertension: Secondary | ICD-10-CM

## 2017-08-25 DIAGNOSIS — E7800 Pure hypercholesterolemia, unspecified: Secondary | ICD-10-CM | POA: Diagnosis not present

## 2017-08-25 DIAGNOSIS — R0602 Shortness of breath: Secondary | ICD-10-CM | POA: Diagnosis not present

## 2017-08-25 DIAGNOSIS — R011 Cardiac murmur, unspecified: Secondary | ICD-10-CM

## 2017-08-25 NOTE — H&P (View-Only) (Signed)
Chief Complaint  Patient presents with  . Follow-up    hypertension   History of Present Illness: 82 yo male with history of CAD, prostate cancer, HTN and HLD who is here today as a new consult, referred by Dr. Mancel Bale, for the evaluation of dyspnea. I have seen him remotely. His last visit in our office was in 2012. Echo in 2010 with normal LV systolic function. He has normal ABI in 2011. Cardiac cath in 2007 with mild luminal irregularities in the LAD.   He is here today for follow up. The patient denies any chest pain, palpitations, lower extremity edema, orthopnea, PND, dizziness, near syncope or syncope. He reports dyspnea and fatigue when climbing hills or when pushing a lawnmower. Never associated chest pain.   Primary Care Physician: Lorene Dy, MD  Past Medical History:  Diagnosis Date  . 23-polyvalent pneumococcal polysaccharide vaccine contraindicated as received shingles vaccine within last 4 weeks 2008   SHINGLES SHOT IN 2008  . Arthritis   . Benign prostatic hypertrophy    history of  . ED (erectile dysfunction) of organic origin    history of  . Heart murmur   . Hyperlipidemia   . Hypertension   . Neuromuscular disorder ([place])   . Other primary cardiomyopathies   . Palpitations   . Prostate cancer ([place])   . Vaccine for streptococcus pneumoniae and influenza 05/12/14   FLUE SHOT 0N 05/12/14 and PNEUMONIA 0N 05/13/14  . 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 CERVICAL FUSION     c5 to c7 posterior  . COLON RESECTION  1995   5 inches removed  . COLONOSCOPY  01/15/12  . EYE SURGERY    . LESION REMOVAL Left 03/18/2013   Procedure: DEBRIDEMENT LEFT LONG DISTAL INTERPHALANGEAL JOINT/DEBRIDEMENT DIP JOINT LOOSE BODIES LEFT LONG DIP;  Surgeon: Cammie Sickle., MD;  Location: [facility];  Service: Orthopedics;  Laterality: Left;  . PARTIAL COLECTOMY    . PROSTATE SURGERY  2005  . SMALL INTESTINE SURGERY       Current Outpatient Medications  Medication Sig Dispense Refill  . amLODipine (NORVASC) 5 MG tablet Take 1 tablet by mouth daily.  3  . Ascorbic Acid (VITAMIN C PO) Take 2,000 mg by mouth daily.    Marland Kitchen aspirin 81 MG tablet Take 81 mg by mouth daily.      . calcium carbonate (OS-CAL) 600 MG TABS Take 600 mg by mouth daily.      . Cholecalciferol (VITAMIN D3) 2000 UNITS TABS Take by mouth daily.    . clonazePAM (KLONOPIN) 0.5 MG tablet Take 0.5-1 tablets (0.25-0.5 mg total) by mouth at bedtime. 30 tablet 3  . Glucosamine-Chondroit-Vit C-Mn (GLUCOSAMINE 1500 COMPLEX PO) Take by mouth daily.      Marland Kitchen lovastatin (MEVACOR) 20 MG tablet Take 20 mg by mouth at bedtime.      . methocarbamol (ROBAXIN) 750 MG tablet     . Multiple Vitamins-Minerals (MULTIVITAMIN ADULTS 50+) TABS Take 1 tablet by mouth daily.    . mupirocin ointment (BACTROBAN) 2 % Place 1 application into the nose 2 (two) times daily. 22 g 0  . saw palmetto 160 MG capsule Take 160 mg by mouth 2 (two) times daily.      . sildenafil (VIAGRA) 100 MG tablet Take 100 mg by mouth daily as needed for erectile dysfunction.    . tamsulosin (FLOMAX) 0.4 MG CAPS capsule Take 0.4 mg by  mouth daily.    Marland Kitchen testosterone cypionate (DEPOTESTOTERONE CYPIONATE) 200 MG/ML injection     . tiZANidine (ZANAFLEX) 4 MG tablet Take 4 mg by mouth every 6 (six) hours as needed for muscle spasms.     No current facility-administered medications for this visit.     No Known Allergies  Social History   Socioeconomic History  . Marital status: Married    Spouse name: Pamala Hurry  . Number of children: 2  . Years of education: College   . Highest education level: Not on file  Social Needs  . Financial resource strain: Not on file  . Food insecurity - worry: Not on file  . Food insecurity - inability: Not on file  . Transportation needs - medical: Not on file  . Transportation needs - non-medical: Not on file  Occupational History  . Occupation: retired     Comment: 2003  Tobacco Use  . Smoking status: Never Smoker  . Smokeless tobacco: Never Used  Substance and Sexual Activity  . Alcohol use: No    Alcohol/week: 0.0 oz  . Drug use: No  . Sexual activity: No    Birth control/protection: Abstinence  Other Topics Concern  . Not on file  Social History Narrative   Patient lives at home with his spouse.   Caffeine Use: 6 cups daily    Family History  Problem Relation Age of Onset  . Coronary artery disease Unknown        family history  . Stroke Mother        also heart problems  . Kidney failure Father        also heart problems  . Lung cancer Father     Review of Systems:  As stated in the HPI and otherwise negative.   BP 104/70   Pulse 68   Ht 5' 11.5" (1.816 m)   Wt 185 lb 8 oz (84.1 kg)   SpO2 95%   BMI 25.51 kg/m   Physical Examination: General: Well developed, well nourished, NAD  HEENT: OP clear, mucus membranes moist  SKIN: warm, dry. No rashes. Neuro: No focal deficits  Musculoskeletal: Muscle strength 5/5 all ext  Psychiatric: Mood and affect normal  Neck: No JVD, no carotid bruits, no thyromegaly, no lymphadenopathy.  Lungs:Clear bilaterally, no wheezes, rhonci, crackles Cardiovascular: Regular rate and rhythm. Systolic murmur.  Abdomen:Soft. Bowel sounds present. Non-tender.  Extremities: No lower extremity edema. Pulses are 2 + in the bilateral DP/PT.  EKG:  EKG is ordered today. The ekg ordered today demonstrates NSR, rate 68 bpm. Non-specific T wave flattening.   Recent Labs: No results found for requested labs within last 8760 hours.   Lipid Panel No results found for: CHOL, TRIG, HDL, CHOLHDL, VLDL, LDLCALC, LDLDIRECT   Wt Readings from Last 3 Encounters:  08/25/17 185 lb 8 oz (84.1 kg)  04/30/15 182 lb 6 oz (82.7 kg)  08/16/14 192 lb (87.1 kg)     Other studies Reviewed: Additional studies/ records that were reviewed today include: . Review of the above records demonstrates:     Assessment and Plan:   1. HTN: BP is controlled. No changes.   2. HLD: Lipids followed in primary care. Continue statin.   3. Dyspnea: He is having dyspnea with exertion. Given advanced age, will arrange echo to assess LV function. Will arrange nuclear stress test to exclude ischemia.   4. Murmur: Will arrange echo to assess, exclude valve disease.    Current medicines are reviewed at  length with the patient today.  The patient does not have concerns regarding medicines.  The following changes have been made:  no change  Labs/ tests ordered today include:   Orders Placed This Encounter  Procedures  . Myocardial Perfusion Imaging  . EKG 12-Lead  . ECHOCARDIOGRAM COMPLETE     Disposition:   FU with me in 6 weeks   Signed, Lauree Chandler, MD 08/25/2017 9:35 AM    [facility] [place], [place], [place]  [address] Phone: [phone]; Fax: [phone]    Addendum: 09/03/17 1:35 pm\ Pt with abnormal stress test and fatigue worrisome for unstable angina. Will plan cardiac cath with possible PCI Friday 09/05/17 at 10:30am. Risks and benefits of procedure reviewed with pt by phone.   Lauree Chandler 09/03/2017 1:35 PM

## 2017-08-25 NOTE — Progress Notes (Addendum)
Chief Complaint  Patient presents with  . Follow-up    hypertension   History of Present Illness: 82 yo male with history of CAD, prostate cancer, HTN and HLD who is here today as a new consult, referred by Dr. Mancel Bale, for the evaluation of dyspnea. I have seen him remotely. His last visit in our office was in 2012. Echo in 2010 with normal LV systolic function. He has normal ABI in 2011. Cardiac cath in 2007 with mild luminal irregularities in the LAD.   He is here today for follow up. The patient denies any chest pain, palpitations, lower extremity edema, orthopnea, PND, dizziness, near syncope or syncope. He reports dyspnea and fatigue when climbing hills or when pushing a lawnmower. Never associated chest pain.   Primary Care Physician: Lorene Dy, MD  Past Medical History:  Diagnosis Date  . 23-polyvalent pneumococcal polysaccharide vaccine contraindicated as received shingles vaccine within last 4 weeks 2008   SHINGLES SHOT IN 2008  . Arthritis   . Benign prostatic hypertrophy    history of  . ED (erectile dysfunction) of organic origin    history of  . Heart murmur   . Hyperlipidemia   . Hypertension   . Neuromuscular disorder ([place])   . Other primary cardiomyopathies   . Palpitations   . Prostate cancer ([place])   . Vaccine for streptococcus pneumoniae and influenza 05/12/14   FLUE SHOT 0N 05/12/14 and PNEUMONIA 0N 05/13/14  . 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 CERVICAL FUSION     c5 to c7 posterior  . COLON RESECTION  1995   5 inches removed  . COLONOSCOPY  01/15/12  . EYE SURGERY    . LESION REMOVAL Left 03/18/2013   Procedure: DEBRIDEMENT LEFT LONG DISTAL INTERPHALANGEAL JOINT/DEBRIDEMENT DIP JOINT LOOSE BODIES LEFT LONG DIP;  Surgeon: Cammie Sickle., MD;  Location: [facility];  Service: Orthopedics;  Laterality: Left;  . PARTIAL COLECTOMY    . PROSTATE SURGERY  2005  . SMALL INTESTINE SURGERY       Current Outpatient Medications  Medication Sig Dispense Refill  . amLODipine (NORVASC) 5 MG tablet Take 1 tablet by mouth daily.  3  . Ascorbic Acid (VITAMIN C PO) Take 2,000 mg by mouth daily.    Marland Kitchen aspirin 81 MG tablet Take 81 mg by mouth daily.      . calcium carbonate (OS-CAL) 600 MG TABS Take 600 mg by mouth daily.      . Cholecalciferol (VITAMIN D3) 2000 UNITS TABS Take by mouth daily.    . clonazePAM (KLONOPIN) 0.5 MG tablet Take 0.5-1 tablets (0.25-0.5 mg total) by mouth at bedtime. 30 tablet 3  . Glucosamine-Chondroit-Vit C-Mn (GLUCOSAMINE 1500 COMPLEX PO) Take by mouth daily.      Marland Kitchen lovastatin (MEVACOR) 20 MG tablet Take 20 mg by mouth at bedtime.      . methocarbamol (ROBAXIN) 750 MG tablet     . Multiple Vitamins-Minerals (MULTIVITAMIN ADULTS 50+) TABS Take 1 tablet by mouth daily.    . mupirocin ointment (BACTROBAN) 2 % Place 1 application into the nose 2 (two) times daily. 22 g 0  . saw palmetto 160 MG capsule Take 160 mg by mouth 2 (two) times daily.      . sildenafil (VIAGRA) 100 MG tablet Take 100 mg by mouth daily as needed for erectile dysfunction.    . tamsulosin (FLOMAX) 0.4 MG CAPS capsule Take 0.4 mg by  mouth daily.    Marland Kitchen testosterone cypionate (DEPOTESTOTERONE CYPIONATE) 200 MG/ML injection     . tiZANidine (ZANAFLEX) 4 MG tablet Take 4 mg by mouth every 6 (six) hours as needed for muscle spasms.     No current facility-administered medications for this visit.     No Known Allergies  Social History   Socioeconomic History  . Marital status: Married    Spouse name: Pamala Hurry  . Number of children: 2  . Years of education: College   . Highest education level: Not on file  Social Needs  . Financial resource strain: Not on file  . Food insecurity - worry: Not on file  . Food insecurity - inability: Not on file  . Transportation needs - medical: Not on file  . Transportation needs - non-medical: Not on file  Occupational History  . Occupation: retired     Comment: 2003  Tobacco Use  . Smoking status: Never Smoker  . Smokeless tobacco: Never Used  Substance and Sexual Activity  . Alcohol use: No    Alcohol/week: 0.0 oz  . Drug use: No  . Sexual activity: No    Birth control/protection: Abstinence  Other Topics Concern  . Not on file  Social History Narrative   Patient lives at home with his spouse.   Caffeine Use: 6 cups daily    Family History  Problem Relation Age of Onset  . Coronary artery disease Unknown        family history  . Stroke Mother        also heart problems  . Kidney failure Father        also heart problems  . Lung cancer Father     Review of Systems:  As stated in the HPI and otherwise negative.   BP 104/70   Pulse 68   Ht 5' 11.5" (1.816 m)   Wt 185 lb 8 oz (84.1 kg)   SpO2 95%   BMI 25.51 kg/m   Physical Examination: General: Well developed, well nourished, NAD  HEENT: OP clear, mucus membranes moist  SKIN: warm, dry. No rashes. Neuro: No focal deficits  Musculoskeletal: Muscle strength 5/5 all ext  Psychiatric: Mood and affect normal  Neck: No JVD, no carotid bruits, no thyromegaly, no lymphadenopathy.  Lungs:Clear bilaterally, no wheezes, rhonci, crackles Cardiovascular: Regular rate and rhythm. Systolic murmur.  Abdomen:Soft. Bowel sounds present. Non-tender.  Extremities: No lower extremity edema. Pulses are 2 + in the bilateral DP/PT.  EKG:  EKG is ordered today. The ekg ordered today demonstrates NSR, rate 68 bpm. Non-specific T wave flattening.   Recent Labs: No results found for requested labs within last 8760 hours.   Lipid Panel No results found for: CHOL, TRIG, HDL, CHOLHDL, VLDL, LDLCALC, LDLDIRECT   Wt Readings from Last 3 Encounters:  08/25/17 185 lb 8 oz (84.1 kg)  04/30/15 182 lb 6 oz (82.7 kg)  08/16/14 192 lb (87.1 kg)     Other studies Reviewed: Additional studies/ records that were reviewed today include: . Review of the above records demonstrates:     Assessment and Plan:   1. HTN: BP is controlled. No changes.   2. HLD: Lipids followed in primary care. Continue statin.   3. Dyspnea: He is having dyspnea with exertion. Given advanced age, will arrange echo to assess LV function. Will arrange nuclear stress test to exclude ischemia.   4. Murmur: Will arrange echo to assess, exclude valve disease.    Current medicines are reviewed at  length with the patient today.  The patient does not have concerns regarding medicines.  The following changes have been made:  no change  Labs/ tests ordered today include:   Orders Placed This Encounter  Procedures  . Myocardial Perfusion Imaging  . EKG 12-Lead  . ECHOCARDIOGRAM COMPLETE     Disposition:   FU with me in 6 weeks   Signed, Lauree Chandler, MD 08/25/2017 9:35 AM    [facility] [place], [place], [place]  [address] Phone: [phone]; Fax: [phone]    Addendum: 09/03/17 1:35 pm\ Pt with abnormal stress test and fatigue worrisome for unstable angina. Will plan cardiac cath with possible PCI Friday 09/05/17 at 10:30am. Risks and benefits of procedure reviewed with pt by phone.   Lauree Chandler 09/03/2017 1:35 PM

## 2017-08-25 NOTE — Patient Instructions (Signed)
Medication Instructions:  Your physician recommends that you continue on your current medications as directed. Please refer to the Current Medication list given to you today.   Labwork: none  Testing/Procedures: Your physician has requested that you have an exercise stress myoview. For further information please visit [email or web address]. Please follow instruction sheet, as given.  Your physician has requested that you have an echocardiogram. Echocardiography is a painless test that uses sound waves to create images of your heart. It provides your doctor with information about the size and shape of your heart and how well your heart's chambers and valves are working. This procedure takes approximately one hour. There are no restrictions for this procedure.   Follow-Up: Your physician recommends that you schedule a follow-up appointment in: about 4-6 weeks.  Scheduled for Feb 13,2019 at 4:00    Any Other Special Instructions Will Be Listed Below (If Applicable).     If you need a refill on your cardiac medications before your next appointment, please call your pharmacy.

## 2017-08-26 ENCOUNTER — Encounter: Payer: Self-pay | Admitting: Cardiovascular Disease

## 2017-08-28 ENCOUNTER — Telehealth (HOSPITAL_COMMUNITY): Payer: Self-pay | Admitting: *Deleted

## 2017-08-28 ENCOUNTER — Telehealth (HOSPITAL_COMMUNITY): Payer: Self-pay | Admitting: Radiology

## 2017-08-28 NOTE — Telephone Encounter (Signed)
Left message on voicemail in reference to upcoming appointment scheduled for 07/13/18. Phone number given for a call back so details instructions can be given.  Kevin Knight [name]  

## 2017-08-28 NOTE — Telephone Encounter (Signed)
Patient given detailed instructions per Myocardial Perfusion Study Information Sheet for the test on 09/02/2017 at 7:15. Patient notified to arrive 15 minutes early and that it is imperative to arrive on time for appointment to keep from having the test rescheduled.  If you need to cancel or reschedule your appointment, please call the office within 24 hours of your appointment. . Patient verbalized understanding.EHK

## 2017-09-01 ENCOUNTER — Encounter (HOSPITAL_COMMUNITY): Payer: Medicare Other

## 2017-09-01 ENCOUNTER — Other Ambulatory Visit (HOSPITAL_COMMUNITY): Payer: Medicare Other

## 2017-09-02 ENCOUNTER — Ambulatory Visit (HOSPITAL_BASED_OUTPATIENT_CLINIC_OR_DEPARTMENT_OTHER): Payer: Medicare Other

## 2017-09-02 ENCOUNTER — Ambulatory Visit (HOSPITAL_COMMUNITY): Payer: Medicare Other | Attending: Cardiology

## 2017-09-02 ENCOUNTER — Other Ambulatory Visit: Payer: Self-pay

## 2017-09-02 DIAGNOSIS — I2510 Atherosclerotic heart disease of native coronary artery without angina pectoris: Secondary | ICD-10-CM | POA: Insufficient documentation

## 2017-09-02 DIAGNOSIS — R011 Cardiac murmur, unspecified: Secondary | ICD-10-CM

## 2017-09-02 DIAGNOSIS — R0602 Shortness of breath: Secondary | ICD-10-CM

## 2017-09-02 DIAGNOSIS — I083 Combined rheumatic disorders of mitral, aortic and tricuspid valves: Secondary | ICD-10-CM | POA: Insufficient documentation

## 2017-09-02 DIAGNOSIS — I10 Essential (primary) hypertension: Secondary | ICD-10-CM | POA: Diagnosis not present

## 2017-09-02 DIAGNOSIS — E785 Hyperlipidemia, unspecified: Secondary | ICD-10-CM | POA: Insufficient documentation

## 2017-09-02 LAB — MYOCARDIAL PERFUSION IMAGING
CHL CUP NUCLEAR SRS: 2
CHL CUP NUCLEAR SSS: 3
LV sys vol: 52 mL
LVDIAVOL: 114 mL (ref 62–150)
NUC STRESS TID: 0.85
Peak HR: 83 {beats}/min
RATE: 0.3
Rest HR: 73 {beats}/min
SDS: 1

## 2017-09-02 LAB — ECHOCARDIOGRAM COMPLETE
HEIGHTINCHES: 71.5 in
Weight: 2960 oz

## 2017-09-02 MED ORDER — TECHNETIUM TC 99M TETROFOSMIN IV KIT
30.6000 | PACK | Freq: Once | INTRAVENOUS | Status: AC | PRN
  Administered 2017-09-02: 30.6 via INTRAVENOUS
  Filled 2017-09-02: qty 31

## 2017-09-02 MED ORDER — TECHNETIUM TC 99M TETROFOSMIN IV KIT
10.7000 | PACK | Freq: Once | INTRAVENOUS | Status: AC | PRN
  Administered 2017-09-02: 10.7 via INTRAVENOUS
  Filled 2017-09-02: qty 11

## 2017-09-02 MED ORDER — REGADENOSON 0.4 MG/5ML IV SOLN
0.4000 mg | Freq: Once | INTRAVENOUS | Status: AC
  Administered 2017-09-02: 0.4 mg via INTRAVENOUS

## 2017-09-03 ENCOUNTER — Encounter: Payer: Self-pay | Admitting: *Deleted

## 2017-09-03 ENCOUNTER — Other Ambulatory Visit: Payer: Medicare Other | Admitting: *Deleted

## 2017-09-03 ENCOUNTER — Telehealth: Payer: Self-pay | Admitting: *Deleted

## 2017-09-03 DIAGNOSIS — R0609 Other forms of dyspnea: Principal | ICD-10-CM

## 2017-09-03 DIAGNOSIS — R0600 Dyspnea, unspecified: Secondary | ICD-10-CM

## 2017-09-03 DIAGNOSIS — R9439 Abnormal result of other cardiovascular function study: Secondary | ICD-10-CM

## 2017-09-03 NOTE — Telephone Encounter (Signed)
I spoke with pt and verbally went over cath instructions with him.  He will come in for lab work tomorrow.  Will leave instructions at front desk for him to pick up when here for lab work

## 2017-09-03 NOTE — Addendum Note (Signed)
Addended by: Lauree Chandler D on: 09/03/2017 01:38 PM   Modules accepted: Orders, SmartSet

## 2017-09-03 NOTE — Telephone Encounter (Signed)
I spoke to Kevin Knight. He would like to proceed with a cath. I have put him on for Friday 09/05/17 at 10:30. Cath lab has scheduled. Can we have him come in tomorrow for pre-cath labs? I will put in cath orders. Risks and benefits of cath reviewed with pt by phone.   Thanks, chris

## 2017-09-03 NOTE — Telephone Encounter (Signed)
I spoke with pt and reviewed stress test and echo results with him.  He would like to proceed with cath and would like Dr. Angelena Form to call him.  Best number to reach pt is mobile number --[phone]

## 2017-09-03 NOTE — Telephone Encounter (Signed)
-----   Message from [name], MD sent at 09/03/2017 10:00 AM EST ----- Stress test is mildly abnormal. I cannot exclude this being due to CAD. He had mild CAD by cath in 2007. If he is still feeling tired, we can plan a cath to exclude obstructive CAD. If he wishes to go down this route, I could call him to review the cath or I could see him in the office to discuss. Kevin Knight

## 2017-09-04 ENCOUNTER — Other Ambulatory Visit: Payer: Medicare Other

## 2017-09-04 ENCOUNTER — Telehealth: Payer: Self-pay | Admitting: *Deleted

## 2017-09-04 LAB — PROTIME-INR
INR: 1 (ref 0.8–1.2)
PROTHROMBIN TIME: 10.8 s (ref 9.1–12.0)

## 2017-09-04 LAB — BASIC METABOLIC PANEL
BUN/Creatinine Ratio: 17 (ref 10–24)
BUN: 18 mg/dL (ref 8–27)
CALCIUM: 9.3 mg/dL (ref 8.6–10.2)
CHLORIDE: 103 mmol/L (ref 96–106)
CO2: 24 mmol/L (ref 20–29)
Creatinine, Ser: 1.08 mg/dL (ref 0.76–1.27)
GFR calc Af Amer: 75 mL/min/{1.73_m2} (ref >59)
GFR, EST NON AFRICAN AMERICAN: 65 mL/min/{1.73_m2} (ref >59)
Glucose: 98 mg/dL (ref 65–99)
POTASSIUM: 4.2 mmol/L (ref 3.5–5.2)
Sodium: 142 mmol/L (ref 134–144)

## 2017-09-04 LAB — CBC
HEMATOCRIT: 34.4 % — AB (ref 37.5–51.0)
HEMOGLOBIN: 11.9 g/dL — AB (ref 13.0–17.7)
MCH: 29.9 pg (ref 26.6–33.0)
MCHC: 34.6 g/dL (ref 31.5–35.7)
MCV: 86 fL (ref 79–97)
Platelets: 148 10*3/uL — ABNORMAL LOW (ref 150–379)
RBC: 3.98 x10E6/uL — AB (ref 4.14–5.80)
RDW: 13.8 % (ref 12.3–15.4)
WBC: 6.5 10*3/uL (ref 3.4–10.8)

## 2017-09-04 NOTE — Telephone Encounter (Signed)
Patient contacted pre-catheterization at [facility] scheduled for: [facility] 09/05/17 10:30 AM  Verified arrival time and place: [facility] NT/Main A -8 AM Confirmed AM meds to be taken pre-cath with sip of water:  Take ASA 81 mg AM of Do not use Viagra  Confirmed patient has responsible person to drive home post procedure and observe patient for 24 hours: yes

## 2017-09-05 ENCOUNTER — Ambulatory Visit (HOSPITAL_COMMUNITY)
Admission: RE | Admit: 2017-09-05 | Discharge: 2017-09-05 | Disposition: A | Discharge status: Home / Self Care | Payer: Medicare Other | Source: Ambulatory Visit | Attending: Cardiovascular Disease | Admitting: Cardiovascular Disease

## 2017-09-05 ENCOUNTER — Encounter (HOSPITAL_COMMUNITY)
Admission: RE | Disposition: A | Discharge status: Home / Self Care | Payer: Self-pay | Source: Ambulatory Visit | Attending: Cardiovascular Disease

## 2017-09-05 DIAGNOSIS — I2510 Atherosclerotic heart disease of native coronary artery without angina pectoris: Secondary | ICD-10-CM | POA: Diagnosis not present

## 2017-09-05 DIAGNOSIS — R011 Cardiac murmur, unspecified: Secondary | ICD-10-CM | POA: Insufficient documentation

## 2017-09-05 DIAGNOSIS — I10 Essential (primary) hypertension: Secondary | ICD-10-CM | POA: Diagnosis not present

## 2017-09-05 DIAGNOSIS — E785 Hyperlipidemia, unspecified: Secondary | ICD-10-CM | POA: Insufficient documentation

## 2017-09-05 DIAGNOSIS — R9439 Abnormal result of other cardiovascular function study: Secondary | ICD-10-CM

## 2017-09-05 DIAGNOSIS — Z8249 Family history of ischemic heart disease and other diseases of the circulatory system: Secondary | ICD-10-CM | POA: Insufficient documentation

## 2017-09-05 DIAGNOSIS — Z7982 Long term (current) use of aspirin: Secondary | ICD-10-CM | POA: Insufficient documentation

## 2017-09-05 DIAGNOSIS — Z79899 Other long term (current) drug therapy: Secondary | ICD-10-CM | POA: Diagnosis not present

## 2017-09-05 DIAGNOSIS — I200 Unstable angina: Secondary | ICD-10-CM | POA: Diagnosis present

## 2017-09-05 DIAGNOSIS — R0602 Shortness of breath: Secondary | ICD-10-CM

## 2017-09-05 HISTORY — PX: LEFT HEART CATH AND CORONARY ANGIOGRAPHY: CATH118249

## 2017-09-05 SURGERY — LEFT HEART CATH AND CORONARY ANGIOGRAPHY
Anesthesia: LOCAL

## 2017-09-05 MED ORDER — SODIUM CHLORIDE 0.9 % IV SOLN: 250.0000 mL | INTRAVENOUS | Status: DC | PRN

## 2017-09-05 MED ORDER — FENTANYL CITRATE (PF) 100 MCG/2ML IJ SOLN
INTRAMUSCULAR | Status: DC | PRN
  Administered 2017-09-05: 25 ug via INTRAVENOUS

## 2017-09-05 MED ORDER — VERAPAMIL HCL 2.5 MG/ML IV SOLN
INTRAVENOUS | Status: DC | PRN
  Administered 2017-09-05: 10 mL via INTRA_ARTERIAL

## 2017-09-05 MED ORDER — HEPARIN SODIUM (PORCINE) 1000 UNIT/ML IJ SOLN
INTRAMUSCULAR | Status: AC
  Filled 2017-09-05: qty 1

## 2017-09-05 MED ORDER — HEPARIN (PORCINE) IN NACL 2-0.9 UNIT/ML-% IJ SOLN
INTRAMUSCULAR | Status: AC
  Filled 2017-09-05: qty 1000

## 2017-09-05 MED ORDER — HEPARIN SODIUM (PORCINE) 1000 UNIT/ML IJ SOLN
INTRAMUSCULAR | Status: DC | PRN
  Administered 2017-09-05: 4000 [IU] via INTRAVENOUS

## 2017-09-05 MED ORDER — LIDOCAINE HCL (PF) 1 % IJ SOLN
INTRAMUSCULAR | Status: AC
  Filled 2017-09-05: qty 30

## 2017-09-05 MED ORDER — SODIUM CHLORIDE 0.9% FLUSH: 3.0000 mL | INTRAVENOUS | Status: DC | PRN

## 2017-09-05 MED ORDER — SODIUM CHLORIDE 0.9% FLUSH: 3.0000 mL | Freq: Two times a day (BID) | INTRAVENOUS | Status: DC

## 2017-09-05 MED ORDER — VERAPAMIL HCL 2.5 MG/ML IV SOLN
INTRAVENOUS | Status: AC
  Filled 2017-09-05: qty 2

## 2017-09-05 MED ORDER — HEPARIN (PORCINE) IN NACL 2-0.9 UNIT/ML-% IJ SOLN
INTRAMUSCULAR | Status: AC | PRN
  Administered 2017-09-05: 1000 mL

## 2017-09-05 MED ORDER — SODIUM CHLORIDE 0.9 % IV SOLN: INTRAVENOUS | Status: AC

## 2017-09-05 MED ORDER — IOPAMIDOL (ISOVUE-370) INJECTION 76%
INTRAVENOUS | Status: AC
  Filled 2017-09-05: qty 100

## 2017-09-05 MED ORDER — MIDAZOLAM HCL 2 MG/2ML IJ SOLN
INTRAMUSCULAR | Status: AC
  Filled 2017-09-05: qty 2

## 2017-09-05 MED ORDER — ASPIRIN 81 MG PO CHEW: 81.0000 mg | CHEWABLE_TABLET | ORAL | Status: DC

## 2017-09-05 MED ORDER — LIDOCAINE HCL (PF) 1 % IJ SOLN
INTRAMUSCULAR | Status: DC | PRN
  Administered 2017-09-05: 2 mL via INTRADERMAL

## 2017-09-05 MED ORDER — IOPAMIDOL (ISOVUE-370) INJECTION 76%
INTRAVENOUS | Status: DC | PRN
Start: 2017-09-05 — End: 2017-09-05
  Administered 2017-09-05: 60 mL via INTRA_ARTERIAL

## 2017-09-05 MED ORDER — MIDAZOLAM HCL 2 MG/2ML IJ SOLN
INTRAMUSCULAR | Status: DC | PRN
  Administered 2017-09-05: 1 mg via INTRAVENOUS

## 2017-09-05 MED ORDER — SODIUM CHLORIDE 0.9 % IV SOLN
INTRAVENOUS | Status: DC
  Administered 2017-09-05: 09:00:00 via INTRAVENOUS

## 2017-09-05 MED ORDER — FENTANYL CITRATE (PF) 100 MCG/2ML IJ SOLN
INTRAMUSCULAR | Status: AC
  Filled 2017-09-05: qty 2

## 2017-09-05 SURGICAL SUPPLY — 9 items
CATH IMPULSE 5F ANG/FL3.5 (CATHETERS) ×1 IMPLANT
DEVICE RAD COMP TR BAND LRG (VASCULAR PRODUCTS) ×1 IMPLANT
GLIDESHEATH SLEND SS 6F .021 (SHEATH) ×1 IMPLANT
GUIDEWIRE INQWIRE 1.5J.035X260 (WIRE) IMPLANT
INQWIRE 1.5J .035X260CM (WIRE) ×2
KIT HEART LEFT (KITS) ×2 IMPLANT
PACK CARDIAC CATHETERIZATION (CUSTOM PROCEDURE TRAY) ×2 IMPLANT
TRANSDUCER W/STOPCOCK (MISCELLANEOUS) ×2 IMPLANT
TUBING CIL FLEX 10 FLL-RA (TUBING) ×2 IMPLANT

## 2017-09-05 NOTE — Interval H&P Note (Signed)
History and Physical Interval Note:  09/05/2017 10:59 AM  Benjiman Core  has presented today for cardiac cath with the diagnosis of abnormal stress - unstable angina  The various methods of treatment have been discussed with the patient and family. After consideration of risks, benefits and other options for treatment, the patient has consented to  Procedure(s): LEFT HEART CATH AND CORONARY ANGIOGRAPHY (N/A) as a surgical intervention .  The patient's history has been reviewed, patient examined, no change in status, stable for surgery.  I have reviewed the patient's chart and labs.  Questions were answered to the patient's satisfaction.    Cath Lab Visit (complete for each Cath Lab visit)  Clinical Evaluation Leading to the Procedure:   ACS: No.  Non-ACS:    Anginal Classification: CCS III  Anti-ischemic medical therapy: Minimal Therapy (1 class of medications)  Non-Invasive Test Results: Low-risk stress test findings: cardiac mortality <1%/year  Prior CABG: No previous CABG         Lauree Chandler

## 2017-09-05 NOTE — Discharge Instructions (Signed)
**Note Kevin Knight-identified via Obfuscation** Radial Site Care °Refer to this sheet in the next few weeks. These instructions provide you with information about caring for yourself after your procedure. Your health care provider may also give you more specific instructions. Your treatment has been planned according to current medical practices, but problems sometimes occur. Call your health care provider if you have any problems or questions after your procedure. °What can I expect after the procedure? °After your procedure, it is typical to have the following: °· Bruising at the radial site that usually fades within 1-2 weeks. °· Blood collecting in the tissue (hematoma) that may be painful to the touch. It should usually decrease in size and tenderness within 1-2 weeks. ° °Follow these instructions at home: °· Take medicines only as directed by your health care provider. °· You may shower 24-48 hours after the procedure or as directed by your health care provider. Remove the bandage (dressing) and gently wash the site with plain soap and water. Pat the area dry with a clean towel. Do not rub the site, because this may cause bleeding. °· Do not take baths, swim, or use a hot tub until your health care provider approves. °· Check your insertion site every day for redness, swelling, or drainage. °· Do not apply powder or lotion to the site. °· Do not flex or bend the affected arm for 24 hours or as directed by your health care provider. °· Do not push or pull heavy objects with the affected arm for 24 hours or as directed by your health care provider. °· Do not lift over 10 lb (4.5 kg) for 5 days after your procedure or as directed by your health care provider. °· Ask your health care provider when it is okay to: °? Return to work or school. °? Resume usual physical activities or sports. °? Resume sexual activity. °· Do not drive home if you are discharged the same day as the procedure. Have someone else drive you. °· You may drive 24 hours after the procedure  unless otherwise instructed by your health care provider. °· Do not operate machinery or power tools for 24 hours after the procedure. °· If your procedure was done as an outpatient procedure, which means that you went home the same day as your procedure, a responsible adult should be with you for the first 24 hours after you arrive home. °· Keep all follow-up visits as directed by your health care provider. This is important. °Contact a health care provider if: °· You have a fever. °· You have chills. °· You have increased bleeding from the radial site. Hold pressure on the site. °Get help right away if: °· You have unusual pain at the radial site. °· You have redness, warmth, or swelling at the radial site. °· You have drainage (other than a small amount of blood on the dressing) from the radial site. °· The radial site is bleeding, and the bleeding does not stop after 30 minutes of holding steady pressure on the site. °· Your arm or hand becomes pale, cool, tingly, or numb. °This information is not intended to replace advice given to you by your health care provider. Make sure you discuss any questions you have with your health care provider. °Document Released: 08/31/2010 Document Revised: 01/04/2016 Document Reviewed: 02/14/2014 °Elsevier Interactive Patient Education © 2018 [employer] ° °

## 2017-09-07 ENCOUNTER — Encounter (HOSPITAL_COMMUNITY): Payer: Self-pay | Admitting: Cardiovascular Disease

## 2017-09-24 ENCOUNTER — Ambulatory Visit: Payer: Medicare Other | Admitting: Cardiovascular Disease

## 2017-09-24 ENCOUNTER — Encounter: Payer: Self-pay | Admitting: Cardiovascular Disease

## 2017-09-24 VITALS — BP 120/60 | HR 76 | Ht 71.5 in | Wt 188.8 lb

## 2017-09-24 DIAGNOSIS — I712 Thoracic aortic aneurysm, without rupture, unspecified: Secondary | ICD-10-CM

## 2017-09-24 DIAGNOSIS — E7800 Pure hypercholesterolemia, unspecified: Secondary | ICD-10-CM

## 2017-09-24 DIAGNOSIS — I2510 Atherosclerotic heart disease of native coronary artery without angina pectoris: Secondary | ICD-10-CM

## 2017-09-24 DIAGNOSIS — I10 Essential (primary) hypertension: Secondary | ICD-10-CM | POA: Diagnosis not present

## 2017-09-24 NOTE — Progress Notes (Signed)
Chief Complaint  Patient presents with  . Follow-up    HTN   History of Present Illness: 82 yo male with history of CAD, prostate cancer, HTN and HLD who is here today for cardiac follow up. I saw him as a new consult 08/25/17 for evaluation of dyspnea. He was known to have normal LV function by echo in 2010. He had normal ABI in 2011. Cardiac cath in 2007 with mild luminal irregularities in the LAD. Given his symptoms, I arranged a nuclear stress test and echo. Echo 09/02/17 showed normal LV systolic function, NWGN=56-21%. Grade 1 diastolic dysfunction, mild AI and mild dilation of the aortic root. Nuclear stress test 08/13/17 that showed possible ischemia. Cardiac cath on 09/05/17 with mild non-obstructive CAD (20% mid RCA stenosis, 30% proximal LAD stenosis, 40% ostial Diagonal stenosis).   He is here today for follow up. The patient denies any chest pain, dyspnea, palpitations, lower extremity edema, orthopnea, PND, dizziness, near syncope or syncope.   Primary Care Physician: Lorene Dy, MD  Past Medical History:  Diagnosis Date  . 23-polyvalent pneumococcal polysaccharide vaccine contraindicated as received shingles vaccine within last 4 weeks 2008   SHINGLES SHOT IN 2008  . Arthritis   . Benign prostatic hypertrophy    history of  . ED (erectile dysfunction) of organic origin    history of  . Heart murmur   . Hyperlipidemia   . Hypertension   . Neuromuscular disorder ([place])   . Other primary cardiomyopathies   . Palpitations   . Prostate cancer ([place])   . Vaccine for streptococcus pneumoniae and influenza 05/12/14   FLUE SHOT 0N 05/12/14 and PNEUMONIA 0N 05/13/14  . 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 CERVICAL FUSION     c5 to c7 posterior  . COLON RESECTION  1995   5 inches removed  . COLONOSCOPY  01/15/12  . EYE SURGERY    . LEFT HEART CATH AND CORONARY ANGIOGRAPHY N/A 09/05/2017   Procedure: LEFT HEART CATH AND CORONARY  ANGIOGRAPHY;  Surgeon: [name], MD;  Location: [place] [facility];  Service: Cardiovascular;  Laterality: N/A;  . LESION REMOVAL Left 03/18/2013   Procedure: DEBRIDEMENT LEFT LONG DISTAL INTERPHALANGEAL JOINT/DEBRIDEMENT DIP JOINT LOOSE BODIES LEFT LONG DIP;  Surgeon: Cammie Sickle., MD;  Location: [facility];  Service: Orthopedics;  Laterality: Left;  . PARTIAL COLECTOMY    . PROSTATE SURGERY  2005  . SMALL INTESTINE SURGERY      Current Outpatient Medications  Medication Sig Dispense Refill  . amLODipine (NORVASC) 5 MG tablet Take 1 tablet by mouth daily.  3  . Ascorbic Acid (VITAMIN C PO) Take 2,000 mg by mouth daily.    Marland Kitchen aspirin 81 MG tablet Take 81 mg by mouth daily.      . calcium carbonate (OS-CAL) 600 MG TABS Take 600 mg by mouth daily.      . Cholecalciferol (VITAMIN D3) 2000 UNITS TABS Take 1 tablet by mouth daily.     . clonazePAM (KLONOPIN) 0.5 MG tablet Take 0.5-1 tablets (0.25-0.5 mg total) by mouth at bedtime. 30 tablet 3  . Glucosamine-Chondroit-Vit C-Mn (GLUCOSAMINE 1500 COMPLEX PO) Take 1 tablet by mouth daily.     Marland Kitchen lovastatin (MEVACOR) 20 MG tablet Take 20 mg by mouth at bedtime.      . Multiple Vitamins-Minerals (MULTIVITAMIN ADULTS 50+) TABS Take 1 tablet by mouth daily.    . sildenafil (VIAGRA)  25 MG tablet Take 25 mg by mouth daily as needed for erectile dysfunction.     . tamsulosin (FLOMAX) 0.4 MG CAPS capsule Take 0.8 mg by mouth daily.     Marland Kitchen tiZANidine (ZANAFLEX) 4 MG tablet Take 4 mg by mouth every 6 (six) hours as needed for muscle spasms.     No current facility-administered medications for this visit.     No Known Allergies  Social History   Socioeconomic History  . Marital status: Married    Spouse name: Pamala Hurry  . Number of children: 2  . Years of education: College   . Highest education level: Not on file  Social Needs  . Financial resource strain: Not on file  . Food insecurity - worry: Not on file  . Food  insecurity - inability: Not on file  . Transportation needs - medical: Not on file  . Transportation needs - non-medical: Not on file  Occupational History  . Occupation: retired    Comment: 2003  Tobacco Use  . Smoking status: Never Smoker  . Smokeless tobacco: Never Used  Substance and Sexual Activity  . Alcohol use: No    Alcohol/week: 0.0 oz  . Drug use: No  . Sexual activity: No    Birth control/protection: Abstinence  Other Topics Concern  . Not on file  Social History Narrative   Patient lives at home with his spouse.   Caffeine Use: 6 cups daily    Family History  Problem Relation Age of Onset  . Coronary artery disease Unknown        family history  . Stroke Mother        also heart problems  . Kidney failure Father        also heart problems  . Lung cancer Father     Review of Systems:  As stated in the HPI and otherwise negative.   BP 120/60   Pulse 76   Ht 5' 11.5" (1.816 m)   Wt 188 lb 12.8 oz (85.6 kg)   SpO2 97%   BMI 25.97 kg/m   Physical Examination:  General: Well developed, well nourished, NAD  HEENT: OP clear, mucus membranes moist  SKIN: warm, dry. No rashes. Neuro: No focal deficits  Musculoskeletal: Muscle strength 5/5 all ext  Psychiatric: Mood and affect normal  Neck: No JVD, no carotid bruits, no thyromegaly, no lymphadenopathy.  Lungs:Clear bilaterally, no wheezes, rhonci, crackles Cardiovascular: Regular rate and rhythm. No murmurs, gallops or rubs. Abdomen:Soft. Bowel sounds present. Non-tender.  Extremities: No lower extremity edema. Pulses are 2 + in the bilateral DP/PT.  Echo 09/02/17: - Left ventricle: The cavity size was normal. Systolic function was   normal. The estimated ejection fraction was in the range of 60%   to 65%. Wall motion was normal; there were no regional wall   motion abnormalities. Doppler parameters are consistent with   abnormal left ventricular relaxation (grade 1 diastolic   dysfunction). - Aortic  valve: Trileaflet; mildly thickened, mildly calcified   leaflets. There was mild regurgitation. - Aorta: Aortic root dimension: 42 mm (ED). - Ascending aorta: The ascending aorta was mildly dilated. - Mitral valve: Mildly thickened leaflets . Mild prolapse,   involving the posterior leaflet. There was trivial regurgitation. - Tricuspid valve: There was mild regurgitation. - Pulmonary arteries: Systolic pressure was mildly increased. PA   peak pressure: 31 mm Hg (S).  Cardiac cath 09/05/17:   Prox RCA to Mid RCA lesion is 20% stenosed.  Prox LAD  lesion is 30% stenosed.  Ost 1st Diag lesion is 40% stenosed.  Diagnostic Diagram       Implants       EKG:  EKG is not ordered today. The ekg ordered today demonstrates   Recent Labs: 09/03/2017: BUN 18; Creatinine, Ser 1.08; Hemoglobin 11.9; Platelets 148; Potassium 4.2; Sodium 142   Lipid Panel No results found for: CHOL, TRIG, HDL, CHOLHDL, VLDL, LDLCALC, LDLDIRECT   Wt Readings from Last 3 Encounters:  09/24/17 188 lb 12.8 oz (85.6 kg)  09/05/17 185 lb (83.9 kg)  09/02/17 185 lb (83.9 kg)     Other studies Reviewed: Additional studies/ records that were reviewed today include: . Review of the above records demonstrates:    Assessment and Plan:   1. CAD without angina: Mild CAD by cath January 2019. LV function is normal. Continue ASA and statin.   2. HTN: BP is well controlled. No changes.   3. HLD: Lipids followed in primary care. Continue statin.   4. Aortic valve insufficiency: mild by echo January 2019. Repeat echo January 2021.   5. Thoracic aortic aneurysm: Mildly dilated by echo. We discussed arranging a CTA of his aorta but he wishes to wait. I will discuss again in 6 months.   Current medicines are reviewed at length with the patient today.  The patient does not have concerns regarding medicines.  The following changes have been made:  no change  Labs/ tests ordered today include:   No orders of the  defined types were placed in this encounter.    Disposition:   FU with me in 6 months   Signed, Lauree Chandler, MD 09/24/2017 4:08 PM    [facility] [address], [place], [place]  [address] Phone: [phone]; Fax: [phone]

## 2017-09-24 NOTE — Patient Instructions (Signed)

## 2018-08-25 ENCOUNTER — Ambulatory Visit (INDEPENDENT_AMBULATORY_CARE_PROVIDER_SITE_OTHER): Payer: Medicare Other | Admitting: Orthopaedic Surgery

## 2018-08-25 ENCOUNTER — Ambulatory Visit (INDEPENDENT_AMBULATORY_CARE_PROVIDER_SITE_OTHER): Payer: Self-pay

## 2018-08-25 DIAGNOSIS — M1811 Unilateral primary osteoarthritis of first carpometacarpal joint, right hand: Secondary | ICD-10-CM | POA: Diagnosis not present

## 2018-08-25 NOTE — Progress Notes (Signed)
Office Visit Note   Patient: Kevin Knight           Date of Birth: [birth year]           MRN: [id number] Visit Date: 08/25/2018              Requested by: Lorene Dy, [address], [place], [place] [address] PCP: Lorene Dy, MD   Assessment & Plan: Visit Diagnoses:  1. Primary osteoarthritis of first carpometacarpal joint of right hand     Plan: Impression is right thumb CMC arthritis with suspected concurrent right carpal tunnel syndrome.  Patient will continue to use his thumb brace for support.  Cortisone injection was performed for the [facility] joint today.  We will arrange for the patient to receive a nerve conduction study to evaluate for right carpal tunnel syndrome.  Questions encouraged and answered.  Follow-up after the nerve conduction studies.  Follow-Up Instructions: Return if symptoms worsen or fail to improve.   Orders:  Orders Placed This Encounter  Procedures  . XR Hand Complete Right  . Ambulatory referral to Physical Medicine Rehab   No orders of the defined types were placed in this encounter.     Procedures: No procedures performed   Clinical Data: No additional findings.   Subjective: Chief Complaint  Patient presents with  . Right Hand - Pain    Kevin Knight is a 83 year old gentleman who comes in for evaluation of his right hand pain and numbness.  The numbness and pain are localized to the base of the thumb as well as in his fingertips.  This is worse with use of the hand.  The numbness is often worse at night.  He has a constant dull aching pain that sometimes sharp at the base of the thumb.  He has taken Advil with temporary relief.   Review of Systems  Constitutional: Negative.   All other systems reviewed and are negative.    Objective: Vital Signs: There were no vitals taken for this visit.  Physical Exam Vitals signs and nursing note reviewed.  Constitutional:      Appearance: He is well-developed.  HENT:     Head:  Normocephalic and atraumatic.  Eyes:     Pupils: Pupils are equal, round, and reactive to light.  Neck:     Musculoskeletal: Neck supple.  Pulmonary:     Effort: Pulmonary effort is normal.  Abdominal:     Palpations: Abdomen is soft.  Musculoskeletal: Normal range of motion.  Skin:    General: Skin is warm.  Neurological:     Mental Status: He is alert and oriented to person, place, and time.  Psychiatric:        Behavior: Behavior normal.        Thought Content: Thought content normal.        Judgment: Judgment normal.     Ortho Exam Right hand exam shows a negative grind test.  Mildly positive carpal tunnel compressive signs. Specialty Comments:  No specialty comments available.  Imaging: Xr Hand Complete Right  Result Date: 08/25/2018 Advanced DJD of right thumb CMC joint    PMFS History: Patient Active Problem List   Diagnosis Date Noted  . Abnormal stress test   . Shortness of breath   . LIMB PAIN 04/24/2010  . HYPERTENSION, BENIGN 04/20/2009  . MITRAL VALVE DISORDERS 04/20/2009  . CARDIOMYOPATHY, SECONDARY 04/20/2009  . HYPERLIPIDEMIA 04/16/2009  . HYPERTENSION, UNSPECIFIED 04/16/2009  . CARDIOMYOPATHY 04/16/2009  . PALPITATIONS 04/16/2009  .  ERECTILE DYSFUNCTION, ORGANIC, HX OF 04/16/2009  . BENIGN PROSTATIC HYPERTROPHY, HX OF 04/16/2009   Past Medical History:  Diagnosis Date  . 23-polyvalent pneumococcal polysaccharide vaccine contraindicated as received shingles vaccine within last 4 weeks 2008   SHINGLES SHOT IN 2008  . Arthritis   . Benign prostatic hypertrophy    history of  . ED (erectile dysfunction) of organic origin    history of  . Heart murmur   . Hyperlipidemia   . Hypertension   . Neuromuscular disorder ([place])   . Other primary cardiomyopathies   . Palpitations   . Prostate cancer ([place])   . Vaccine for streptococcus pneumoniae and influenza 05/12/14   FLUE SHOT 0N 05/12/14 and PNEUMONIA 0N 05/13/14  . Volvulus ([place])     Family  History  Problem Relation Age of Onset  . Coronary artery disease Unknown        family history  . Stroke Mother        also heart problems  . Kidney failure Father        also heart problems  . Lung cancer Father     Past Surgical History:  Procedure Laterality Date  . CERVICAL FUSION     c5 to c7 posterior  . COLON RESECTION  1995   5 inches removed  . COLONOSCOPY  01/15/12  . EYE SURGERY    . LEFT HEART CATH AND CORONARY ANGIOGRAPHY N/A 09/05/2017   Procedure: LEFT HEART CATH AND CORONARY ANGIOGRAPHY;  Surgeon: [name], MD;  Location: [place] [facility];  Service: Cardiovascular;  Laterality: N/A;  . LESION REMOVAL Left 03/18/2013   Procedure: DEBRIDEMENT LEFT LONG DISTAL INTERPHALANGEAL JOINT/DEBRIDEMENT DIP JOINT LOOSE BODIES LEFT LONG DIP;  Surgeon: Cammie Sickle., MD;  Location: [facility];  Service: Orthopedics;  Laterality: Left;  . PARTIAL COLECTOMY    . PROSTATE SURGERY  2005  . SMALL INTESTINE SURGERY     Social History   Occupational History  . Occupation: retired    Comment: 2003  Tobacco Use  . Smoking status: Never Smoker  . Smokeless tobacco: Never Used  Substance and Sexual Activity  . Alcohol use: No    Alcohol/week: 0.0 standard drinks  . Drug use: No  . Sexual activity: Never    Birth control/protection: Abstinence

## 2018-09-04 ENCOUNTER — Ambulatory Visit (INDEPENDENT_AMBULATORY_CARE_PROVIDER_SITE_OTHER): Payer: Medicare Other | Admitting: Physical Medicine and Rehabilitation

## 2018-09-04 ENCOUNTER — Encounter (INDEPENDENT_AMBULATORY_CARE_PROVIDER_SITE_OTHER): Payer: Self-pay | Admitting: Physical Medicine and Rehabilitation

## 2018-09-04 DIAGNOSIS — R202 Paresthesia of skin: Secondary | ICD-10-CM | POA: Diagnosis not present

## 2018-09-04 NOTE — Progress Notes (Signed)
 .  Numeric Pain Rating Scale and Functional Assessment Average Pain 0   In the last MONTH (on 0-10 scale) has pain interfered with the following?  1. General activity like being  able to carry out your everyday physical activities such as walking, climbing stairs, carrying groceries, or moving a chair?  Rating(4)

## 2018-09-08 ENCOUNTER — Ambulatory Visit (INDEPENDENT_AMBULATORY_CARE_PROVIDER_SITE_OTHER): Payer: Medicare Other

## 2018-09-08 ENCOUNTER — Ambulatory Visit (INDEPENDENT_AMBULATORY_CARE_PROVIDER_SITE_OTHER): Payer: Medicare Other | Admitting: Orthopaedic Surgery

## 2018-09-08 ENCOUNTER — Encounter (INDEPENDENT_AMBULATORY_CARE_PROVIDER_SITE_OTHER): Payer: Self-pay | Admitting: Orthopaedic Surgery

## 2018-09-08 DIAGNOSIS — M542 Cervicalgia: Secondary | ICD-10-CM | POA: Diagnosis not present

## 2018-09-08 DIAGNOSIS — R202 Paresthesia of skin: Secondary | ICD-10-CM | POA: Diagnosis not present

## 2018-09-08 MED ORDER — METHYLPREDNISOLONE 4 MG PO TBPK: ORAL_TABLET | ORAL | 0 refills | Status: DC

## 2018-09-08 NOTE — Progress Notes (Signed)
Office Visit Note   Patient: Kevin Knight           Date of Birth: 1936-03-01           MRN: [id number] Visit Date: 09/08/2018              Requested by: Lorene Dy, [address], [place], [place] [address] PCP: Lorene Dy, MD   Assessment & Plan: Visit Diagnoses:  1. Paresthesias in right hand   2. Neck pain     Plan: Impression is cervical spine radiculopathy, right thumb CMC osteoarthritis and right thumb trigger finger.  What appears to be most symptomatic is the radiculopathy.  We will refer the patient to Dr. Kathyrn Sheriff for further work-up.  Follow-Up Instructions: Return if symptoms worsen or fail to improve.   Orders:  Orders Placed This Encounter  Procedures  . XR Cervical Spine 2 or 3 views  . Ambulatory referral to Neurosurgery   Meds ordered this encounter  Medications  . methylPREDNISolone (MEDROL DOSEPAK) 4 MG TBPK tablet    Sig: Take as directed    Dispense:  21 tablet    Refill:  0      Procedures: No procedures performed   Clinical Data: No additional findings.   Subjective: Chief Complaint  Patient presents with  . Neck - Pain    HPI patient is a pleasant 83 year old gentleman who presents to our clinic today to discuss nerve conduction study/EMG right upper extremity.  He has been dealing with right CMC osteoarthritis and questionable right thumb trigger finger.  His main issue recently has been numbness, tingling and burning to all 5 fingers of the right hand.  Recent EMG was essentially negative.  He does note that this is been ongoing for the past 20 years.  He has had a C5-7 fusion by Dr. Lorin Mercy several years back.  Review of Systems as detailed in HPI.  All others reviewed and are negative   Objective: Vital Signs: There were no vitals taken for this visit.  Physical Exam.  Well-developed well-nourished gentleman in no acute distress.  Alert and oriented x3.  Ortho Exam examination of the right hand reveals  moderate tenderness to the first [facility] joint.  Positive grind.  He does have a tender and palpable nodule A1 pulley right thumb.  No active triggering.  Decreased sensation to all 5 fingers.  No increased symptoms with [place] or [place].  Limited range of motion of the cervical spine secondary to stiffness.  No spinous or paraspinous tenderness.  Specialty Comments:  No specialty comments available.  Imaging: Xr Cervical Spine 2 Or 3 Views  Result Date: 09/08/2018 Significant diffuse degenerative changes throughout the cervical spine.  Previous fusion C5-7    PMFS History: Patient Active Problem List   Diagnosis Date Noted  . Paresthesias in right hand 09/08/2018  . Abnormal stress test   . Shortness of breath   . LIMB PAIN 04/24/2010  . HYPERTENSION, BENIGN 04/20/2009  . MITRAL VALVE DISORDERS 04/20/2009  . CARDIOMYOPATHY, SECONDARY 04/20/2009  . HYPERLIPIDEMIA 04/16/2009  . HYPERTENSION, UNSPECIFIED 04/16/2009  . CARDIOMYOPATHY 04/16/2009  . PALPITATIONS 04/16/2009  . ERECTILE DYSFUNCTION, ORGANIC, HX OF 04/16/2009  . BENIGN PROSTATIC HYPERTROPHY, HX OF 04/16/2009   Past Medical History:  Diagnosis Date  . 23-polyvalent pneumococcal polysaccharide vaccine contraindicated as received shingles vaccine within last 4 weeks 2008   SHINGLES SHOT IN 2008  . Arthritis   . Benign prostatic hypertrophy    history of  .  ED (erectile dysfunction) of organic origin    history of  . Heart murmur   . Hyperlipidemia   . Hypertension   . Neuromuscular disorder ([place])   . Other primary cardiomyopathies   . Palpitations   . Prostate cancer ([place])   . Vaccine for streptococcus pneumoniae and influenza 05/12/14   FLUE SHOT 0N 05/12/14 and PNEUMONIA 0N 05/13/14  . Volvulus ([place])     Family History  Problem Relation Age of Onset  . Coronary artery disease Unknown        family history  . Stroke Mother        also heart problems  . Kidney failure Father        also heart problems  . Lung  cancer Father     Past Surgical History:  Procedure Laterality Date  . CERVICAL FUSION     c5 to c7 posterior  . COLON RESECTION  1995   5 inches removed  . COLONOSCOPY  01/15/12  . EYE SURGERY    . LEFT HEART CATH AND CORONARY ANGIOGRAPHY N/A 09/05/2017   Procedure: LEFT HEART CATH AND CORONARY ANGIOGRAPHY;  Surgeon: [name], MD;  Location: [facility] [facility];  Service: Cardiovascular;  Laterality: N/A;  . LESION REMOVAL Left 03/18/2013   Procedure: DEBRIDEMENT LEFT LONG DISTAL INTERPHALANGEAL JOINT/DEBRIDEMENT DIP JOINT LOOSE BODIES LEFT LONG DIP;  Surgeon: Cammie Sickle., MD;  Location: [facility];  Service: Orthopedics;  Laterality: Left;  . PARTIAL COLECTOMY    . PROSTATE SURGERY  2005  . SMALL INTESTINE SURGERY     Social History   Occupational History  . Occupation: retired    Comment: 2003  Tobacco Use  . Smoking status: Never Smoker  . Smokeless tobacco: Never Used  Substance and Sexual Activity  . Alcohol use: No    Alcohol/week: 0.0 standard drinks  . Drug use: No  . Sexual activity: Never    Birth control/protection: Abstinence

## 2018-09-08 NOTE — Progress Notes (Signed)
ZACORY FIOLA - 83 y.o. male MRN [id number]  Date of birth: [birth year]  Office Visit Note: Visit Date: 09/04/2018 PCP: Lorene Dy, MD Referred by: Lorene Dy, MD  Subjective: Chief Complaint  Patient presents with  . Right Hand - Tingling   HPI: Kevin Knight is a 83 y.o. male who comes in today At the request of Dr. Eduard Roux for electrodiagnostic study of the right upper limb.  Patient is right-hand dominant and reports 20 years of tingling in the right hand and somewhat of a nondermatomal fashion in the index middle ring and fifth digit.  He reports is gotten worse over the years.  He reports using the hand makes it worse.  He denies real pain.  He denies any specific radicular complaints or specific trauma.  He has not had prior electrodiagnostic study.  ROS Otherwise per HPI.  Assessment & Plan: Visit Diagnoses:  1. Paresthesia of skin     Plan: Impression: Essentially NORMAL electrodiagnostic study of the right upper limb.  There is no significant electrodiagnostic evidence of nerve entrapment, brachial plexopathy or cervical radiculopathy.  The ulnar sensory nerve was a little bit difficult to see and there was reduced amplitude and this may represent some entrapment of the ulnar nerve at the wrist however ulnar motor nerve was completely normal.  This would not explain his symptoms and total.   As you know, purely sensory or demyelinating radiculopathies and chemical radiculitis may not be detected with this particular electrodiagnostic study.  Recommendations: 1.  Follow-up with referring physician. 2.  Continue current management of symptoms.    Meds & Orders: No orders of the defined types were placed in this encounter.   Orders Placed This Encounter  Procedures  . NCV with EMG (electromyography)    Follow-up: Return for  Eduard Roux, M.D..   Procedures: No procedures performed  EMG & NCV Findings: Evaluation of the right median (across palm)  sensory nerve showed prolonged distal peak latency (Palm, 2.3 ms).  The right ulnar sensory nerve showed reduced amplitude (5.3 V).  All remaining nerves (as indicated in the following tables) were within normal limits.    All examined muscles (as indicated in the following table) showed no evidence of electrical instability.    Impression: Essentially NORMAL electrodiagnostic study of the right upper limb.  There is no significant electrodiagnostic evidence of nerve entrapment, brachial plexopathy or cervical radiculopathy.  The ulnar sensory nerve was a little bit difficult to see and there was reduced amplitude and this may represent some entrapment of the ulnar nerve at the wrist however ulnar motor nerve was completely normal.  This would not explain his symptoms and total.   As you know, purely sensory or demyelinating radiculopathies and chemical radiculitis may not be detected with this particular electrodiagnostic study.  Recommendations: 1.  Follow-up with referring physician. 2.  Continue current management of symptoms.  ___________________________ Laurence Spates FAAPMR Board Certified, American Board of Physical Medicine and Rehabilitation    Nerve Conduction Studies Anti Sensory Summary Table   Stim Site NR Peak (ms) Norm Peak (ms) P-T Amp (V) Norm P-T Amp Site1 Site2 Delta-P (ms) Dist (cm) Vel (m/s) Norm Vel (m/s)  Right Median Acr Palm Anti Sensory (2nd Digit)  30.5C  Wrist    3.7 <4.0 19.1 >10 Wrist Palm 1.4 0.0    Palm    *2.3 <2.0 10.6         Right Radial Anti Sensory (Base 1st Digit)  31.4C  Wrist    2.3 <3.1 9.8  Wrist Base 1st Digit 2.3 0.0    Right Ulnar Anti Sensory (5th Digit)  30.9C  Wrist    3.2 <3.7 *5.3 >15.0 Wrist 5th Digit 3.2 14.0 44 >38   Motor Summary Table   Stim Site NR Onset (ms) Norm Onset (ms) O-P Amp (mV) Norm O-P Amp Site1 Site2 Delta-0 (ms) Dist (cm) Vel (m/s) Norm Vel (m/s)  Right Median Motor (Abd Poll Brev)  31.9C  Wrist    3.4 <4.2  6.1 >5 Elbow Wrist 4.7 25.5 54 >50  Elbow    8.1  6.0         Right Ulnar Motor (Abd Dig Min)  32.5C  Wrist    3.3 <4.2 9.6 >3 B Elbow Wrist 4.4 24.0 55 >53  B Elbow    7.7  6.7  A Elbow B Elbow 1.1 9.5 86 >53  A Elbow    8.8  8.6          EMG   Side Muscle Nerve Root Ins Act Fibs Psw Amp Dur Poly Recrt Int Fraser Din Comment  Right Abd Poll Brev Median C8-T1 Nml Nml Nml Nml Nml 0 Nml Nml   Right 1stDorInt Ulnar C8-T1 Nml Nml Nml Nml Nml 0 Nml Nml   Right PronatorTeres Median C6-7 Nml Nml Nml Nml Nml 0 Nml Nml   Right Biceps Musculocut C5-6 Nml Nml Nml Nml Nml 0 Nml Nml   Right Deltoid Axillary C5-6 Nml Nml Nml Nml Nml 0 Nml Nml     Nerve Conduction Studies Anti Sensory Left/Right Comparison   Stim Site L Lat (ms) R Lat (ms) L-R Lat (ms) L Amp (V) R Amp (V) L-R Amp (%) Site1 Site2 L Vel (m/s) R Vel (m/s) L-R Vel (m/s)  Median Acr Palm Anti Sensory (2nd Digit)  30.5C  Wrist  3.7   19.1  Wrist Palm     Palm  *2.3   10.6        Radial Anti Sensory (Base 1st Digit)  31.4C  Wrist  2.3   9.8  Wrist Base 1st Digit     Ulnar Anti Sensory (5th Digit)  30.9C  Wrist  3.2   *5.3  Wrist 5th Digit  44    Motor Left/Right Comparison   Stim Site L Lat (ms) R Lat (ms) L-R Lat (ms) L Amp (mV) R Amp (mV) L-R Amp (%) Site1 Site2 L Vel (m/s) R Vel (m/s) L-R Vel (m/s)  Median Motor (Abd Poll Brev)  31.9C  Wrist  3.4   6.1  Elbow Wrist  54   Elbow  8.1   6.0        Ulnar Motor (Abd Dig Min)  32.5C  Wrist  3.3   9.6  B Elbow Wrist  55   B Elbow  7.7   6.7  A Elbow B Elbow  86   A Elbow  8.8   8.6           Waveforms:            Clinical History: No specialty comments available.   He reports that he has never smoked. He has never used smokeless tobacco. No results for input(s): HGBA1C, LABURIC in the last 8760 hours.  Objective:  VS:  HT:    WT:   BMI:     BP:   HR: bpm  TEMP: ( )  RESP:  Physical Exam Musculoskeletal:  General: No tenderness.     Comments: Inspection  reveals no atrophy of the bilateral APB or FDI or hand intrinsics. There is no swelling, color changes, allodynia or dystrophic changes. There is 5 out of 5 strength in the bilateral wrist extension, finger abduction and long finger flexion. There is intact sensation to light touch in all dermatomal and peripheral nerve distributions.  There is a negative Phalen's test bilaterally. There is a negative Hoffmann's test bilaterally.  Skin:    General: Skin is warm and dry.     Findings: No erythema or rash.  Neurological:     Mental Status: He is alert and oriented to person, place, and time.     Motor: No weakness or abnormal muscle tone.     Coordination: Coordination normal.     Ortho Exam Imaging: No results found.  Past Medical/Family/Surgical/Social History: Medications & Allergies reviewed per EMR, new medications updated. Patient Active Problem List   Diagnosis Date Noted  . Abnormal stress test   . Shortness of breath   . LIMB PAIN 04/24/2010  . HYPERTENSION, BENIGN 04/20/2009  . MITRAL VALVE DISORDERS 04/20/2009  . CARDIOMYOPATHY, SECONDARY 04/20/2009  . HYPERLIPIDEMIA 04/16/2009  . HYPERTENSION, UNSPECIFIED 04/16/2009  . CARDIOMYOPATHY 04/16/2009  . PALPITATIONS 04/16/2009  . ERECTILE DYSFUNCTION, ORGANIC, HX OF 04/16/2009  . BENIGN PROSTATIC HYPERTROPHY, HX OF 04/16/2009   Past Medical History:  Diagnosis Date  . 23-polyvalent pneumococcal polysaccharide vaccine contraindicated as received shingles vaccine within last 4 weeks 2008   SHINGLES SHOT IN 2008  . Arthritis   . Benign prostatic hypertrophy    history of  . ED (erectile dysfunction) of organic origin    history of  . Heart murmur   . Hyperlipidemia   . Hypertension   . Neuromuscular disorder ([place])   . Other primary cardiomyopathies   . Palpitations   . Prostate cancer ([place])   . Vaccine for streptococcus pneumoniae and influenza 05/12/14   FLUE SHOT 0N 05/12/14 and PNEUMONIA 0N 05/13/14  . Volvulus  ([place])    Family History  Problem Relation Age of Onset  . Coronary artery disease Unknown        family history  . Stroke Mother        also heart problems  . Kidney failure Father        also heart problems  . Lung cancer Father    Past Surgical History:  Procedure Laterality Date  . CERVICAL FUSION     c5 to c7 posterior  . COLON RESECTION  1995   5 inches removed  . COLONOSCOPY  01/15/12  . EYE SURGERY    . LEFT HEART CATH AND CORONARY ANGIOGRAPHY N/A 09/05/2017   Procedure: LEFT HEART CATH AND CORONARY ANGIOGRAPHY;  Surgeon: [name], MD;  Location: [facility];  Service: Cardiovascular;  Laterality: N/A;  . LESION REMOVAL Left 03/18/2013   Procedure: DEBRIDEMENT LEFT LONG DISTAL INTERPHALANGEAL JOINT/DEBRIDEMENT DIP JOINT LOOSE BODIES LEFT LONG DIP;  Surgeon: Cammie Sickle., MD;  Location: [facility];  Service: Orthopedics;  Laterality: Left;  . PARTIAL COLECTOMY    . PROSTATE SURGERY  2005  . SMALL INTESTINE SURGERY     Social History   Occupational History  . Occupation: retired    Comment: 2003  Tobacco Use  . Smoking status: Never Smoker  . Smokeless tobacco: Never Used  Substance and Sexual Activity  . Alcohol use: No    Alcohol/week: 0.0 standard drinks  .  Drug use: No  . Sexual activity: Never    Birth control/protection: Abstinence

## 2018-09-08 NOTE — Procedures (Signed)
EMG & NCV Findings: Evaluation of the right median (across palm) sensory nerve showed prolonged distal peak latency (Palm, 2.3 ms).  The right ulnar sensory nerve showed reduced amplitude (5.3 V).  All remaining nerves (as indicated in the following tables) were within normal limits.    All examined muscles (as indicated in the following table) showed no evidence of electrical instability.    Impression: Essentially NORMAL electrodiagnostic study of the right upper limb.  There is no significant electrodiagnostic evidence of nerve entrapment, brachial plexopathy or cervical radiculopathy.  The ulnar sensory nerve was a little bit difficult to see and there was reduced amplitude and this may represent some entrapment of the ulnar nerve at the wrist however ulnar motor nerve was completely normal.  This would not explain his symptoms and total.   As you know, purely sensory or demyelinating radiculopathies and chemical radiculitis may not be detected with this particular electrodiagnostic study.  Recommendations: 1.  Follow-up with referring physician. 2.  Continue current management of symptoms.  ___________________________ Laurence Spates FAAPMR Board Certified, American Board of Physical Medicine and Rehabilitation    Nerve Conduction Studies Anti Sensory Summary Table   Stim Site NR Peak (ms) Norm Peak (ms) P-T Amp (V) Norm P-T Amp Site1 Site2 Delta-P (ms) Dist (cm) Vel (m/s) Norm Vel (m/s)  Right Median Acr Palm Anti Sensory (2nd Digit)  30.5C  Wrist    3.7 <4.0 19.1 >10 Wrist Palm 1.4 0.0    Palm    *2.3 <2.0 10.6         Right Radial Anti Sensory (Base 1st Digit)  31.4C  Wrist    2.3 <3.1 9.8  Wrist Base 1st Digit 2.3 0.0    Right Ulnar Anti Sensory (5th Digit)  30.9C  Wrist    3.2 <3.7 *5.3 >15.0 Wrist 5th Digit 3.2 14.0 44 >38   Motor Summary Table   Stim Site NR Onset (ms) Norm Onset (ms) O-P Amp (mV) Norm O-P Amp Site1 Site2 Delta-0 (ms) Dist (cm) Vel (m/s) Norm Vel (m/s)   Right Median Motor (Abd Poll Brev)  31.9C  Wrist    3.4 <4.2 6.1 >5 Elbow Wrist 4.7 25.5 54 >50  Elbow    8.1  6.0         Right Ulnar Motor (Abd Dig Min)  32.5C  Wrist    3.3 <4.2 9.6 >3 B Elbow Wrist 4.4 24.0 55 >53  B Elbow    7.7  6.7  A Elbow B Elbow 1.1 9.5 86 >53  A Elbow    8.8  8.6          EMG   Side Muscle Nerve Root Ins Act Fibs Psw Amp Dur Poly Recrt Int Fraser Din Comment  Right Abd Poll Brev Median C8-T1 Nml Nml Nml Nml Nml 0 Nml Nml   Right 1stDorInt Ulnar C8-T1 Nml Nml Nml Nml Nml 0 Nml Nml   Right PronatorTeres Median C6-7 Nml Nml Nml Nml Nml 0 Nml Nml   Right Biceps Musculocut C5-6 Nml Nml Nml Nml Nml 0 Nml Nml   Right Deltoid Axillary C5-6 Nml Nml Nml Nml Nml 0 Nml Nml     Nerve Conduction Studies Anti Sensory Left/Right Comparison   Stim Site L Lat (ms) R Lat (ms) L-R Lat (ms) L Amp (V) R Amp (V) L-R Amp (%) Site1 Site2 L Vel (m/s) R Vel (m/s) L-R Vel (m/s)  Median Acr Palm Anti Sensory (2nd Digit)  30.5C  Wrist  3.7  19.1  Wrist Palm     Palm  *2.3   10.6        Radial Anti Sensory (Base 1st Digit)  31.4C  Wrist  2.3   9.8  Wrist Base 1st Digit     Ulnar Anti Sensory (5th Digit)  30.9C  Wrist  3.2   *5.3  Wrist 5th Digit  44    Motor Left/Right Comparison   Stim Site L Lat (ms) R Lat (ms) L-R Lat (ms) L Amp (mV) R Amp (mV) L-R Amp (%) Site1 Site2 L Vel (m/s) R Vel (m/s) L-R Vel (m/s)  Median Motor (Abd Poll Brev)  31.9C  Wrist  3.4   6.1  Elbow Wrist  54   Elbow  8.1   6.0        Ulnar Motor (Abd Dig Min)  32.5C  Wrist  3.3   9.6  B Elbow Wrist  55   B Elbow  7.7   6.7  A Elbow B Elbow  86   A Elbow  8.8   8.6           Waveforms:

## 2018-12-08 IMAGING — NM NM MISC PROCEDURE
3 series · 18 of 18 positions shown · non-contrast
Comparison: none

[Series 1: rest-mc_(id)_sa · 6.4mm · 6.40mm/px · 6 of 64 frames shown]
[frame 6/64]
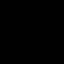
[frame 16/64]
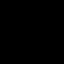
[frame 27/64]
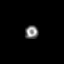
[frame 38/64]
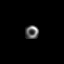
[frame 48/64]
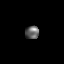
[frame 59/64]
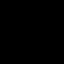

[Series 1: stress-sum-em-mc_(id)_sa · 6.4mm · 6.40mm/px · 6 of 64 frames shown]
[frame 6/64]
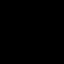
[frame 16/64]
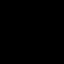
[frame 27/64]
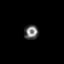
[frame 38/64]
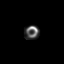
[frame 48/64]
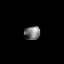
[frame 59/64]
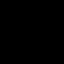

[Series 1: stress-gsp-mc_(id)_sa · 6.4mm · 6.40mm/px · 6 of 512 frames shown]
[frame 43/512]
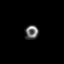
[frame 128/512]
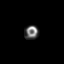
[frame 214/512]
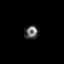
[frame 299/512]
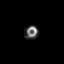
[frame 384/512]
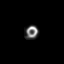
[frame 470/512]
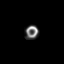

[18 of 18 positions shown; findings below may reference images not displayed]

Canned report from images found in remote index.

Refer to host system for actual result text.

## 2019-07-12 ENCOUNTER — Other Ambulatory Visit: Payer: Self-pay | Admitting: Cardiology

## 2019-07-12 DIAGNOSIS — Z2001 Contact with and (suspected) exposure to intestinal infectious diseases due to Escherichia coli (E. coli): Secondary | ICD-10-CM

## 2019-07-13 LAB — NOVEL CORONAVIRUS, NAA: SARS-CoV-2, NAA: NOT DETECTED

## 2019-09-15 ENCOUNTER — Ambulatory Visit (INDEPENDENT_AMBULATORY_CARE_PROVIDER_SITE_OTHER): Payer: Medicare Other | Admitting: Otolaryngology

## 2019-09-15 ENCOUNTER — Encounter (INDEPENDENT_AMBULATORY_CARE_PROVIDER_SITE_OTHER): Payer: Self-pay | Admitting: Otolaryngology

## 2019-09-15 ENCOUNTER — Other Ambulatory Visit: Payer: Self-pay

## 2019-09-15 VITALS — Temp 97.3°F

## 2019-09-15 DIAGNOSIS — H903 Sensorineural hearing loss, bilateral: Secondary | ICD-10-CM | POA: Diagnosis not present

## 2019-09-15 DIAGNOSIS — H6123 Impacted cerumen, bilateral: Secondary | ICD-10-CM | POA: Diagnosis not present

## 2019-09-15 NOTE — Progress Notes (Signed)
HPI: Kevin Knight is a 84 y.o. male who presents for evaluation of cerumen buildup referred by his wife because she has noticed some white debris when she looks in her ear canals.  Has always had hearing problems and wears bilateral hearing aids.  No drainage from his ears.  No blockage of his hearing aids..  Past Medical History:  Diagnosis Date  . 23-polyvalent pneumococcal polysaccharide vaccine contraindicated as received shingles vaccine within last 4 weeks 2008   SHINGLES SHOT IN 2008  . Arthritis   . Benign prostatic hypertrophy    history of  . ED (erectile dysfunction) of organic origin    history of  . Heart murmur   . Hyperlipidemia   . Hypertension   . Neuromuscular disorder ([place])   . Other primary cardiomyopathies   . Palpitations   . Prostate cancer ([place])   . Vaccine for streptococcus pneumoniae and influenza 05/12/14   FLUE SHOT 0N 05/12/14 and PNEUMONIA 0N 05/13/14  . Volvulus [facility])    Past Surgical History:  Procedure Laterality Date  . CERVICAL FUSION     c5 to c7 posterior  . COLON RESECTION  1995   5 inches removed  . COLONOSCOPY  01/15/12  . EYE SURGERY    . LEFT HEART CATH AND CORONARY ANGIOGRAPHY N/A 09/05/2017   Procedure: LEFT HEART CATH AND CORONARY ANGIOGRAPHY;  Surgeon: [name], MD;  Location: [facility];  Service: Cardiovascular;  Laterality: N/A;  . LESION REMOVAL Left 03/18/2013   Procedure: DEBRIDEMENT LEFT LONG DISTAL INTERPHALANGEAL JOINT/DEBRIDEMENT DIP JOINT LOOSE BODIES LEFT LONG DIP;  Surgeon: Cammie Sickle., MD;  Location: [facility];  Service: Orthopedics;  Laterality: Left;  . PARTIAL COLECTOMY    . PROSTATE SURGERY  2005  . SMALL INTESTINE SURGERY     Social History   Socioeconomic History  . Marital status: Married    Spouse name: Pamala Hurry  . Number of children: 2  . Years of education: College   . Highest education level: Not on file  Occupational History  . Occupation: retired     Comment: 2003  Tobacco Use  . Smoking status: Never Smoker  . Smokeless tobacco: Never Used  Substance and Sexual Activity  . Alcohol use: No    Alcohol/week: 0.0 standard drinks  . Drug use: No  . Sexual activity: Never    Birth control/protection: Abstinence  Other Topics Concern  . Not on file  Social History Narrative   Patient lives at home with his spouse.   Caffeine Use: 6 cups daily   Social Determinants of Health   Financial Resource Strain:   . Difficulty of Paying Living Expenses: Not on file  Food Insecurity:   . Worried About Charity fundraiser in the Last Year: Not on file  . Ran Out of Food in the Last Year: Not on file  Transportation Needs:   . Lack of Transportation (Medical): Not on file  . Lack of Transportation (Non-Medical): Not on file  Physical Activity:   . Days of Exercise per Week: Not on file  . Minutes of Exercise per Session: Not on file  Stress:   . Feeling of Stress : Not on file  Social Connections:   . Frequency of Communication with Friends and Family: Not on file  . Frequency of Social Gatherings with Friends and Family: Not on file  . Attends Religious Services: Not on file  . Active Member of Clubs or Organizations:  Not on file  . Attends Archivist Meetings: Not on file  . Marital Status: Not on file   Family History  Problem Relation Age of Onset  . Coronary artery disease Unknown        family history  . Stroke Mother        also heart problems  . Kidney failure Father        also heart problems  . Lung cancer Father    No Known Allergies Prior to Admission medications   Medication Sig Start Date End Date Taking? Authorizing Provider  amLODipine (NORVASC) 5 MG tablet Take 1 tablet by mouth daily. 06/25/14  Yes [provider]  Ascorbic Acid (VITAMIN C PO) Take 2,000 mg by mouth daily.   Yes [provider]  aspirin 81 MG tablet Take 81 mg by mouth daily.     Yes [provider]   calcium carbonate (OS-CAL) 600 MG TABS Take 600 mg by mouth daily.     Yes [provider]  Cholecalciferol (VITAMIN D3) 2000 UNITS TABS Take 1 tablet by mouth daily.    Yes [provider]  clonazePAM (KLONOPIN) 0.5 MG tablet Take 0.5-1 tablets (0.25-0.5 mg total) by mouth at bedtime. 07/12/14  Yes Penumalli, Earlean Polka, MD  Glucosamine-Chondroit-Vit C-Mn (GLUCOSAMINE 1500 COMPLEX PO) Take 1 tablet by mouth daily.    Yes [provider]  lovastatin (MEVACOR) 20 MG tablet Take 20 mg by mouth at bedtime.     Yes [provider]  methylPREDNISolone (MEDROL DOSEPAK) 4 MG TBPK tablet Take as directed 09/08/18  Yes Aundra Dubin, PA-C  Multiple Vitamins-Minerals (MULTIVITAMIN ADULTS 50+) TABS Take 1 tablet by mouth daily.   Yes [provider]  sildenafil (VIAGRA) 25 MG tablet Take 25 mg by mouth daily as needed for erectile dysfunction.    Yes [provider]  tamsulosin (FLOMAX) 0.4 MG CAPS capsule Take 0.8 mg by mouth daily.    Yes [provider]  tiZANidine (ZANAFLEX) 4 MG tablet Take 4 mg by mouth every 6 (six) hours as needed for muscle spasms.   Yes [provider]     Positive ROS: Otherwise negative no ear pain.  All other systems have been reviewed and were otherwise negative with the exception of those mentioned in the HPI and as above.  Physical Exam: Constitutional: Alert, well-appearing, no acute distress Ears: External ears without lesions or tenderness. Ear canals he had minimal wax buildup in his ears.  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  TMs were clear bilaterally with good mobility on pneumatic otoscopy.  Ear canals were cleaned with a curette and had minimal nonobstructing wax.. Nasal: External nose without lesions. Clear nasal passages Oral: Oropharynx clear. Neck: No palpable adenopathy or masses Respiratory: Breathing comfortably   Skin: No facial/neck lesions or rash noted.  Cerumen impaction removal  Date/Time: 09/15/2019 10:57 AM Performed by: Rozetta Nunnery, MD Authorized by: Rozetta Nunnery, MD   Consent:    Consent obtained:  Verbal   Consent given by:  Patient   Risks discussed:  Pain and bleeding Procedure details:    Location:  L ear and R ear   Procedure type: curette   Post-procedure details:    Inspection:  TM intact and canal normal   Hearing quality:  Improved   Patient tolerance of procedure:  Tolerated well, no immediate complications Comments:     Minimal  wax buildup in the ears.  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  But this is nonobstructing.  TMs clear bilaterally.    Assessment: Minimal wax buildup Bilateral SNHL uses bilateral hearing aids  Plan: They will follow-up as needed. Recommended follow-up with audiologist concerning evaluation of hearing.  Radene Journey, MD

## 2019-11-29 ENCOUNTER — Ambulatory Visit: Payer: Medicare Other | Admitting: Cardiovascular Disease

## 2019-11-29 ENCOUNTER — Encounter: Payer: Self-pay | Admitting: Cardiovascular Disease

## 2019-11-29 ENCOUNTER — Other Ambulatory Visit: Payer: Self-pay

## 2019-11-29 VITALS — BP 122/70 | HR 81 | Ht 73.0 in | Wt 181.1 lb

## 2019-11-29 DIAGNOSIS — I10 Essential (primary) hypertension: Secondary | ICD-10-CM

## 2019-11-29 DIAGNOSIS — I351 Nonrheumatic aortic (valve) insufficiency: Secondary | ICD-10-CM

## 2019-11-29 DIAGNOSIS — I2510 Atherosclerotic heart disease of native coronary artery without angina pectoris: Secondary | ICD-10-CM | POA: Diagnosis not present

## 2019-11-29 DIAGNOSIS — I712 Thoracic aortic aneurysm, without rupture, unspecified: Secondary | ICD-10-CM

## 2019-11-29 DIAGNOSIS — E7800 Pure hypercholesterolemia, unspecified: Secondary | ICD-10-CM

## 2019-11-29 NOTE — Progress Notes (Signed)
Chief Complaint  Patient presents with  . Follow-up    CAD   History of Present Illness: 84 yo male with history of CAD, prostate cancer, HTN and HLD who is here today for cardiac follow up. He was seen in 2019 for evaluation of dyspnea. He was known to have normal LV function by echo in 2010. He had normal ABI in 2011. Cardiac cath in 2007 with mild luminal irregularities in the LAD. Nuclear stress test January 2019 with possible ischemia. Echo 09/02/17 showed normal LV systolic function, [id number]. Grade 1 diastolic dysfunction, mild AI and mild dilation of the aortic root. Cardiac cath on 09/05/17 with mild non-obstructive CAD (20% mid RCA stenosis, 30% proximal LAD stenosis, 40% ostial Diagonal stenosis).   He is here today for follow up. The patient denies any chest pain, dyspnea, palpitations, lower extremity edema, orthopnea, PND, dizziness, near syncope or syncope.   Primary Care Physician: Lorene Dy, MD  Past Medical History:  Diagnosis Date  . 23-polyvalent pneumococcal polysaccharide vaccine contraindicated as received shingles vaccine within last 4 weeks 2008   SHINGLES SHOT IN 2008  . Arthritis   . Benign prostatic hypertrophy    history of  . ED (erectile dysfunction) of organic origin    history of  . Heart murmur   . Hyperlipidemia   . Hypertension   . Neuromuscular disorder ([place])   . Other primary cardiomyopathies   . Palpitations   . Prostate cancer ([place])   . Vaccine for streptococcus pneumoniae and influenza 05/12/14   FLUE SHOT 0N 05/12/14 and PNEUMONIA 0N 05/13/14  . 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 CERVICAL FUSION     c5 to c7 posterior  . COLON RESECTION  1995   5 inches removed  . COLONOSCOPY  01/15/12  . EYE SURGERY    . LEFT HEART CATH AND CORONARY ANGIOGRAPHY N/A 09/05/2017   Procedure: LEFT HEART CATH AND CORONARY ANGIOGRAPHY;  Surgeon: [name], MD;  Location: [facility];   Service: Cardiovascular;  Laterality: N/A;  . LESION REMOVAL Left 03/18/2013   Procedure: DEBRIDEMENT LEFT LONG DISTAL INTERPHALANGEAL JOINT/DEBRIDEMENT DIP JOINT LOOSE BODIES LEFT LONG DIP;  Surgeon: Cammie Sickle., MD;  Location: [facility];  Service: Orthopedics;  Laterality: Left;  . PARTIAL COLECTOMY    . PROSTATE SURGERY  2005  . SMALL INTESTINE SURGERY      Current Outpatient Medications  Medication Sig Dispense Refill  . Ascorbic Acid (VITAMIN C PO) Take 2,000 mg by mouth daily.    . Cholecalciferol (VITAMIN D3) 2000 UNITS TABS Take 1 tablet by mouth daily.     . clonazePAM (KLONOPIN) 0.5 MG tablet Take 0.5-1 tablets (0.25-0.5 mg total) by mouth at bedtime. 30 tablet 3  . Glucosamine-Chondroit-Vit C-Mn (GLUCOSAMINE 1500 COMPLEX PO) Take 1 tablet by mouth daily.     . Multiple Vitamins-Minerals (MULTIVITAMIN ADULTS 50+) TABS Take 1 tablet by mouth daily.    . rosuvastatin (CRESTOR) 5 MG tablet Take 5 mg by mouth daily.    . tadalafil (CIALIS) 20 MG tablet Take 20 mg by mouth daily as needed for erectile dysfunction.    . tamsulosin (FLOMAX) 0.4 MG CAPS capsule Take 0.8 mg by mouth daily.     Marland Kitchen tiZANidine (ZANAFLEX) 4 MG tablet Take 4 mg by mouth every 6 (six) hours as needed for muscle spasms.     No current facility-administered medications for this visit.  No Known Allergies  Social History   Socioeconomic History  . Marital status: Married    Spouse name: Pamala Hurry  . Number of children: 2  . Years of education: College   . Highest education level: Not on file  Occupational History  . Occupation: retired    Comment: 2003  Tobacco Use  . Smoking status: Never Smoker  . Smokeless tobacco: Never Used  Substance and Sexual Activity  . Alcohol use: No    Alcohol/week: 0.0 standard drinks  . Drug use: No  . Sexual activity: Never    Birth control/protection: Abstinence  Other Topics Concern  . Not on file  Social History Narrative   Patient lives  at home with his spouse.   Caffeine Use: 6 cups daily   Social Determinants of Health   Financial Resource Strain:   . Difficulty of Paying Living Expenses:   Food Insecurity:   . Worried About Charity fundraiser in the Last Year:   . Arboriculturist in the Last Year:   Transportation Needs:   . Film/video editor (Medical):   Marland Kitchen Lack of Transportation (Non-Medical):   Physical Activity:   . Days of Exercise per Week:   . Minutes of Exercise per Session:   Stress:   . Feeling of Stress :   Social Connections:   . Frequency of Communication with Friends and Family:   . Frequency of Social Gatherings with Friends and Family:   . Attends Religious Services:   . Active Member of Clubs or Organizations:   . Attends Archivist Meetings:   Marland Kitchen Marital Status:   Intimate Partner Violence:   . Fear of Current or Ex-Partner:   . Emotionally Abused:   Marland Kitchen Physically Abused:   . Sexually Abused:     Family History  Problem Relation Age of Onset  . Coronary artery disease Unknown        family history  . Stroke Mother        also heart problems  . Kidney failure Father        also heart problems  . Lung cancer Father     Review of Systems:  As stated in the HPI and otherwise negative.   BP 122/70   Pulse 81   Ht 6\' 1"  (1.854 m)   Wt 181 lb 1.9 oz (82.2 kg)   SpO2 96%   BMI 23.90 kg/m   Physical Examination:  General: Well developed, well nourished, NAD  HEENT: OP clear, mucus membranes moist  SKIN: warm, dry. No rashes. Neuro: No focal deficits  Musculoskeletal: Muscle strength 5/5 all ext  Psychiatric: Mood and affect normal  Neck: No JVD, no carotid bruits, no thyromegaly, no lymphadenopathy.  Lungs:Clear bilaterally, no wheezes, rhonci, crackles Cardiovascular: Regular rate and rhythm. No murmurs, gallops or rubs. Abdomen:Soft. Bowel sounds present. Non-tender.  Extremities: No lower extremity edema. Pulses are 2 + in the bilateral DP/PT.  Echo  09/02/17: - Left ventricle: The cavity size was normal. Systolic function was   normal. The estimated ejection fraction was in the range of 60%   to 65%. Wall motion was normal; there were no regional wall   motion abnormalities. Doppler parameters are consistent with   abnormal left ventricular relaxation (grade 1 diastolic   dysfunction). - Aortic valve: Trileaflet; mildly thickened, mildly calcified   leaflets. There was mild regurgitation. - Aorta: Aortic root dimension: 42 mm (ED). - Ascending aorta: The ascending aorta was mildly dilated. -  Mitral valve: Mildly thickened leaflets . Mild prolapse,   involving the posterior leaflet. There was trivial regurgitation. - Tricuspid valve: There was mild regurgitation. - Pulmonary arteries: Systolic pressure was mildly increased. PA   peak pressure: 31 mm Hg (S).  Cardiac cath 09/05/17:   Prox RCA to Mid RCA lesion is 20% stenosed.  Prox LAD lesion is 30% stenosed.  Ost 1st Diag lesion is 40% stenosed.  Diagnostic Diagram       Implants       EKG:  EKG is ordered today. The ekg ordered today demonstrates Sinus with PVCs.   Recent Labs: No results found for requested labs within last 8760 hours.   Lipid Panel No results found for: CHOL, TRIG, HDL, CHOLHDL, VLDL, LDLCALC, LDLDIRECT   Wt Readings from Last 3 Encounters:  11/29/19 181 lb 1.9 oz (82.2 kg)  09/24/17 188 lb 12.8 oz (85.6 kg)  09/05/17 185 lb (83.9 kg)     Other studies Reviewed: Additional studies/ records that were reviewed today include: . Review of the above records demonstrates:    Assessment and Plan:   1. CAD without angina: He has mild CAD by cath January 2019. LV function is normal. No chest pain. Will continue ASA and statin.    2. HTN: BP is controlled. Continue current therapy  3. HLD: Lipids followed in primary care. Continue statin.   4. Aortic valve insufficiency: Mild by echo January 2019. Will repeat echo now.   5. Thoracic aortic  aneurysm: Mildly dilated by echo. We discussed arranging a CTA of his aorta but he wishes to wait. Will see if there are any changes on the echo.  Current medicines are reviewed at length with the patient today.  The patient does not have concerns regarding medicines.  The following changes have been made:  no change  Labs/ tests ordered today include:   Orders Placed This Encounter  Procedures  . EKG 12-Lead  . ECHOCARDIOGRAM COMPLETE     Disposition:   FU with me in 12 months   Signed, Lauree Chandler, MD 11/29/2019 3:23 PM    [facility] [address], [place], [place]  [address] Phone: [phone]; Fax: [phone]

## 2019-11-29 NOTE — Patient Instructions (Addendum)
Medication Instructions:  No changes *If you need a refill on your cardiac medications before your next appointment, please call your pharmacy*   Lab Work: none If you have labs (blood work) drawn today and your tests are completely normal, you will receive your results only by: . MyChart Message (if you have MyChart) OR . A paper copy in the mail If you have any lab test that is abnormal or we need to change your treatment, we will call you to review the results.   Testing/Procedures: Your physician has requested that you have an echocardiogram. Echocardiography is a painless test that uses sound waves to create images of your heart. It provides your doctor with information about the size and shape of your heart and how well your heart's chambers and valves are working. This procedure takes approximately one hour. There are no restrictions for this procedure.   Follow-Up: At [facility], you and your health needs are our priority.  As part of our continuing mission to provide you with exceptional heart care, we have created designated Provider Care Teams.  These Care Teams include your primary Cardiologist (physician) and Advanced Practice Providers (APPs -  Physician Assistants and Nurse Practitioners) who all work together to provide you with the care you need, when you need it.   Your next appointment:   12 month(s)  The format for your next appointment:   In Person  Provider:   You may see Christopher McAlhany, MD or one of the following Advanced Practice Providers on your designated Care Team:    Dayna Dunn, PA-C  Michele Lenze, PA-C   Other Instructions   

## 2019-12-13 ENCOUNTER — Other Ambulatory Visit: Payer: Self-pay

## 2019-12-13 ENCOUNTER — Ambulatory Visit (HOSPITAL_COMMUNITY): Payer: Medicare Other | Attending: Cardiology

## 2019-12-13 DIAGNOSIS — I351 Nonrheumatic aortic (valve) insufficiency: Secondary | ICD-10-CM | POA: Diagnosis present

## 2021-01-29 ENCOUNTER — Telehealth: Payer: Self-pay | Admitting: Cardiovascular Disease

## 2021-01-29 NOTE — Telephone Encounter (Signed)
Patient states for the past 1-2 weeks whenever he grips something tightly with his fingers, they get stuck in that position.

## 2021-01-29 NOTE — Telephone Encounter (Signed)
Spoke with the patient who states that when he grips something tight with his left hand it cramps up and he cant move it. He denies any numbness, tingling, or change in color. Advised patient to contact his PCP in regards to his concerns with hand cramping. Patient verbalized understanding.

## 2021-02-20 ENCOUNTER — Encounter: Payer: Self-pay | Admitting: Orthopaedic Surgery

## 2021-02-20 ENCOUNTER — Ambulatory Visit: Payer: Self-pay

## 2021-02-20 ENCOUNTER — Other Ambulatory Visit: Payer: Self-pay

## 2021-02-20 ENCOUNTER — Ambulatory Visit: Payer: Medicare Other | Admitting: Orthopaedic Surgery

## 2021-02-20 VITALS — Ht 73.0 in | Wt 180.0 lb

## 2021-02-20 DIAGNOSIS — G8929 Other chronic pain: Secondary | ICD-10-CM | POA: Diagnosis not present

## 2021-02-20 DIAGNOSIS — M545 Low back pain, unspecified: Secondary | ICD-10-CM

## 2021-02-20 NOTE — Progress Notes (Signed)
Office Visit Note   Patient: Kevin Knight           Date of Birth: Nov 02, 1935           MRN: [id number] Visit Date: 02/20/2021              Requested by: Lorene Dy, [address], [place],  [place] [address] PCP: Lorene Dy, MD   Assessment & Plan: Visit Diagnoses:  1. Chronic midline low back pain without sciatica     Plan: Based on findings low back pain is likely due to degenerative scoliosis exacerbation.  Based on treatment options he would like to continue to take Tylenol as this is helping with the pain and he is probably going to increase his dose.  I have also made a referral to our office for physical therapy.  Follow-up as needed.  Follow-Up Instructions: Return if symptoms worsen or fail to improve.   Orders:  Orders Placed This Encounter  Procedures   XR Lumbar Spine 2-3 Views   Ambulatory referral to Physical Therapy   No orders of the defined types were placed in this encounter.     Procedures: No procedures performed   Clinical Data: No additional findings.   Subjective: Chief Complaint  Patient presents with   [place] is a 85 year old gentleman here to be evaluated for chronic low back pain for several months.  Denies any injuries.  Denies any pain numbness or tingling in the legs.  The pain is worse with making a bowel movement.  He takes Tylenol on a daily basis for the pain.  He has had no previous surgeries either.  He is retired but previously had a very physical job during his employed years.  Denies any bowel or bladder dysfunction or incontinence.   Review of Systems  Constitutional: Negative.   All other systems reviewed and are negative.   Objective: Vital Signs: Ht 6\' 1"  (1.854 m)   Wt 180 lb (81.6 kg)   BMI 23.75 kg/m   Physical Exam Vitals and nursing note reviewed.  Constitutional:      Appearance: He is well-developed.  Pulmonary:     Effort: Pulmonary effort is normal.   Abdominal:     Palpations: Abdomen is soft.  Skin:    General: Skin is warm.  Neurological:     Mental Status: He is alert and oriented to person, place, and time.  Psychiatric:        Behavior: Behavior normal.        Thought Content: Thought content normal.        Judgment: Judgment normal.    Ortho Exam Low back exam shows slight tenderness to the lumbar spine.  Decreased range of motion secondary to pain.  No focal motor or sensory deficits of the lower extremities. Specialty Comments:  No specialty comments available.  Imaging: XR Lumbar Spine 2-3 Views  Result Date: 02/20/2021 Degenerative scoliosis of L spine    PMFS History: Patient Active Problem List   Diagnosis Date Noted   Chronic midline low back pain without sciatica 02/20/2021   Paresthesias in right hand 09/08/2018   Abnormal stress test    Shortness of breath    LIMB PAIN 04/24/2010   HYPERTENSION, BENIGN 04/20/2009   MITRAL VALVE DISORDERS 04/20/2009   CARDIOMYOPATHY, SECONDARY 04/20/2009   HYPERLIPIDEMIA 04/16/2009   HYPERTENSION, UNSPECIFIED 04/16/2009   CARDIOMYOPATHY 04/16/2009   PALPITATIONS 04/16/2009   ERECTILE DYSFUNCTION, ORGANIC, HX  OF 04/16/2009   BENIGN PROSTATIC HYPERTROPHY, HX OF 04/16/2009   Past Medical History:  Diagnosis Date   23-polyvalent pneumococcal polysaccharide vaccine contraindicated as received shingles vaccine within last 4 weeks 2008   SHINGLES SHOT IN 2008   Arthritis    Benign prostatic hypertrophy    history of   ED (erectile dysfunction) of organic origin    history of   Heart murmur    Hyperlipidemia    Hypertension    Neuromuscular disorder ([facility])    Other primary cardiomyopathies    Palpitations    Prostate cancer ([place])    Vaccine for streptococcus pneumoniae and influenza 05/12/14   FLUE SHOT 0N 05/12/14 and PNEUMONIA 0N 05/13/14   Volvulus ([place])     Family History  Problem Relation Age of Onset   Coronary artery disease Other        family history    Stroke Mother        also heart problems   Kidney failure Father        also heart problems   Lung cancer Father     Past Surgical History:  Procedure Laterality Date   CERVICAL FUSION     c5 to c7 posterior   COLON RESECTION  1995   5 inches removed   COLONOSCOPY  01/15/12   EYE SURGERY     LEFT HEART CATH AND CORONARY ANGIOGRAPHY N/A 09/05/2017   Procedure: LEFT HEART CATH AND CORONARY ANGIOGRAPHY;  Surgeon: [name], MD;  Location: [facility];  Service: Cardiovascular;  Laterality: N/A;   LESION REMOVAL Left 03/18/2013   Procedure: DEBRIDEMENT LEFT LONG DISTAL INTERPHALANGEAL JOINT/DEBRIDEMENT DIP JOINT LOOSE BODIES LEFT LONG DIP;  Surgeon: Cammie Sickle., MD;  Location: [facility];  Service: Orthopedics;  Laterality: Left;   PARTIAL COLECTOMY     PROSTATE SURGERY  2005   SMALL INTESTINE SURGERY     Social History   Occupational History   Occupation: retired    Comment: 2003  Tobacco Use   Smoking status: Never   Smokeless tobacco: Never  Substance and Sexual Activity   Alcohol use: No    Alcohol/week: 0.0 standard drinks   Drug use: No   Sexual activity: Never    Birth control/protection: Abstinence

## 2021-02-27 ENCOUNTER — Other Ambulatory Visit: Payer: Self-pay

## 2021-02-27 ENCOUNTER — Encounter: Payer: Self-pay | Admitting: Orthopaedic Surgery

## 2021-02-27 ENCOUNTER — Ambulatory Visit (INDEPENDENT_AMBULATORY_CARE_PROVIDER_SITE_OTHER): Payer: Medicare Other | Admitting: Orthopaedic Surgery

## 2021-02-27 DIAGNOSIS — G8929 Other chronic pain: Secondary | ICD-10-CM

## 2021-02-27 DIAGNOSIS — M545 Low back pain, unspecified: Secondary | ICD-10-CM

## 2021-02-28 ENCOUNTER — Ambulatory Visit (INDEPENDENT_AMBULATORY_CARE_PROVIDER_SITE_OTHER): Payer: Medicare Other | Admitting: Physical Therapy

## 2021-02-28 ENCOUNTER — Encounter: Payer: Self-pay | Admitting: Physical Therapy

## 2021-02-28 DIAGNOSIS — R293 Abnormal posture: Secondary | ICD-10-CM

## 2021-02-28 DIAGNOSIS — M545 Low back pain, unspecified: Secondary | ICD-10-CM

## 2021-02-28 DIAGNOSIS — M6281 Muscle weakness (generalized): Secondary | ICD-10-CM | POA: Diagnosis not present

## 2021-02-28 DIAGNOSIS — G8929 Other chronic pain: Secondary | ICD-10-CM | POA: Diagnosis not present

## 2021-02-28 NOTE — Therapy (Signed)
[facility] Therapy [address] [place], [place], [address] Phone: [phone]   Fax:  [phone]  Physical Therapy Evaluation  Patient Details  Name: Kevin Knight MRN: [id number] Date of Birth: [birth year] Referring Provider (PT): Leandrew Koyanagi, MD   Encounter Date: 02/28/2021   PT End of Session - 02/28/21 1235     Visit Number 1    Number of Visits 12    Date for PT Re-Evaluation 04/11/21    Authorization Type UHC Medicare    Progress Note Due on Visit 10    PT Start Time 1135    PT Stop Time 1215    PT Time Calculation (min) 40 min    Activity Tolerance Patient tolerated treatment well    Behavior During Therapy [facility] for tasks assessed/performed             Past Medical History:  Diagnosis Date   23-polyvalent pneumococcal polysaccharide vaccine contraindicated as received shingles vaccine within last 4 weeks 2008   SHINGLES SHOT IN 2008   Arthritis    Benign prostatic hypertrophy    history of   ED (erectile dysfunction) of organic origin    history of   Heart murmur    Hyperlipidemia    Hypertension    Neuromuscular disorder ([place])    Other primary cardiomyopathies    Palpitations    Prostate cancer ([place])    Vaccine for streptococcus pneumoniae and influenza 05/12/14   FLUE SHOT 0N 05/12/14 and PNEUMONIA 0N 05/13/14   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[place])     Past Surgical History:  Procedure Laterality Date   CERVICAL FUSION     c5 to c7 posterior   COLON RESECTION  1995   5 inches removed   COLONOSCOPY  01/15/12   EYE SURGERY     LEFT HEART CATH AND CORONARY ANGIOGRAPHY N/A 09/05/2017   Procedure: LEFT HEART CATH AND CORONARY ANGIOGRAPHY;  Surgeon: [name], MD;  Location: [place] [facility];  Service: Cardiovascular;  Laterality: N/A;   LESION REMOVAL Left 03/18/2013   Procedure: DEBRIDEMENT LEFT LONG DISTAL INTERPHALANGEAL JOINT/DEBRIDEMENT DIP JOINT LOOSE BODIES LEFT LONG DIP;  Surgeon: Cammie Sickle., MD;  Location:  [place];  Service: Orthopedics;  Laterality: Left;   PARTIAL COLECTOMY     PROSTATE SURGERY  2005   SMALL INTESTINE SURGERY      There were no vitals filed for this visit.    Subjective Assessment - 02/28/21 1143     Subjective Pt is an 85 y/o male who presents to OPPT for chronic LBP.  He reports severe arthritis which has caused my "back to twist around."  He report LBP is chronic for many decades but was managable up until recently.  He reports pain is worse when constipated.    Pertinent History OA, scoliosis, HTN, hx prostate cancer    Limitations Standing;Walking    Diagnostic tests xrays    Patient Stated Goals improve pain    Currently in Pain? Yes    Pain Score 0-No pain   up to 7/10   Pain Location Back    Pain Orientation Right;Left;Lower    Pain Descriptors / Indicators Constant;Sore    Pain Type Chronic pain    Pain Radiating Towards radiates up back    Pain Onset More than a month ago    Pain Frequency Intermittent    Aggravating Factors  straining on the toilet, walking    Pain Relieving Factors rest, tylenol  [facility] PT Assessment - 02/28/21 1132       Assessment   Medical Diagnosis M54.50,G89.29 (ICD-10-CM) - Chronic midline low back pain without sciatica    Referring Provider (PT) Leandrew Koyanagi, MD    Onset Date/Surgical Date --   chronic   Hand Dominance Left;Right    Next MD Visit PRN    Prior Therapy none      Precautions   Precautions None      Restrictions   Weight Bearing Restrictions No      Balance Screen   Has the patient fallen in the past 6 months No    Has the patient had a decrease in activity level because of a fear of falling?  No    Is the patient reluctant to leave their home because of a fear of falling?  No      Home Environment   Living Environment Private residence    Living Arrangements Spouse/significant other    Type of [place] to enter    Entrance  Stairs-Number of Steps [place] One level    Additional Comments denies difficulty or pain with stairs      Prior Function   Level of Independence Independent    Vocation Retired    [employer] retired from [place] [employer] [place] camping (trying to get rid of motor home)      Cognition   Overall Cognitive Status Within Functional Limits for tasks assessed      Observation/Other Assessments   Focus on Therapeutic Outcomes (FOTO)  53 (predicted 62)      Posture/Postural Control   Posture/Postural Control Postural limitations    Postural Limitations Rounded Shoulders;Forward head    Posture Comments S curve noted lumbar spine      ROM / Strength   AROM / PROM / Strength AROM;Strength      AROM   AROM Assessment Site Lumbar    Lumbar Flexion WNL    Lumbar Extension limited 75%    Lumbar - Right Side Bend limited 75%    Lumbar - Left Side Bend limited 75%    Lumbar - Right Rotation limited 25%    Lumbar - Left Rotation limited 25%      Strength   Strength Assessment Site Hip;Knee    Right/Left Hip Right;Left    Right Hip Flexion 4/5    Right Hip ABduction 4/5    Left Hip Flexion 3+/5    Left Hip ABduction 5/5    Right/Left Knee Right;Left    Right Knee Flexion 5/5    Right Knee Extension 5/5    Left Knee Flexion 5/5    Left Knee Extension 5/5      Flexibility   Soft Tissue Assessment /Muscle Length yes    Hamstrings tightness bil    Piriformis tightness bil      Palpation   SI assessment  pain isolated to Rt SIJ      Special Tests    Special Tests Lumbar    Lumbar Tests Slump Test      Slump test   Findings Negative                        Objective measurements completed on examination: See above findings.       [facility] Adult PT Treatment/Exercise - 02/28/21 1132  Exercises   Exercises Other Exercises    Other Exercises  see pt instructions - reviewed with  pt                    PT Education - 02/28/21 1234     Education Details HEP    Person(s) Educated Patient    Methods Explanation;Demonstration;Handout    Comprehension Verbalized understanding;Returned demonstration;Need further instruction              PT Short Term Goals - 02/28/21 1241       PT SHORT TERM GOAL #1   Title independent with initial HEP    Time 3    Period Weeks    Status New    Target Date 03/21/21               PT Long Term Goals - 02/28/21 1242       PT LONG TERM GOAL #1   Title independent with final HEP    Time 6    Period Weeks    Status New    Target Date 04/11/21      PT LONG TERM GOAL #2   Title FOTO score improved to 62 for improved mobility    Time 6    Period Weeks    Status New    Target Date 04/11/21      PT LONG TERM GOAL #3   Title report pain < 4/10 with activity for improved activity tolerance    Time 6    Period Weeks    Status New    Target Date 04/11/21      PT LONG TERM GOAL #4   Title demonstrate 5/5 bil hip strength for improved function    Time 6    Period Weeks    Status New    Target Date 04/11/21                    Plan - 02/28/21 1236     Clinical Impression Statement Pt is an 85 y/o male who presents to OPPT for acute exacerbation of chronic LBP.  He reports pain is mostly around Rt SIJ, and that symptoms are only increased when pt is constipated.  He does demonstrate decreased ROM and strength as well as postural abnormalities affecting functional mobility.  Will address these deficits, and see if pain improves.  If pain continues to be present and elevated most associated with constipation, recommend referral to specialist.    Personal Factors and Comorbidities Comorbidity 3+    Comorbidities OA, scoliosis, HTN, hx prostate cancer    Examination-Activity Limitations Bend;Squat;Transfers;Lift;Locomotion Level    Examination-Participation Restrictions Community Activity     Stability/Clinical Decision Making Evolving/Moderate complexity    Clinical Decision Making Moderate    Rehab Potential Good    PT Frequency 2x / week    PT Duration 6 weeks    PT Treatment/Interventions ADLs/Self Care Home Management;Cryotherapy;Electrical Stimulation;Moist Heat;Therapeutic exercise;Therapeutic activities;Functional mobility training;Neuromuscular re-education;Patient/family education;Manual techniques;Taping;Dry needling    PT Next Visit Plan review HEP and progress hip/core strength, general mobility    PT Home Exercise Plan Access Code: [email or web address]    Consulted and Agree with Plan of Care Patient             Patient will benefit from skilled therapeutic intervention in order to improve the following deficits and impairments:  Decreased strength, Pain, Difficulty walking, Decreased mobility, Decreased range of motion, Impaired flexibility, Postural dysfunction  Visit Diagnosis: Chronic bilateral  low back pain without sciatica - Plan: PT plan of care cert/re-cert  Abnormal posture - Plan: PT plan of care cert/re-cert  Muscle weakness (generalized) - Plan: PT plan of care cert/re-cert     Problem List Patient Active Problem List   Diagnosis Date Noted   Chronic midline low back pain without sciatica 02/20/2021   Paresthesias in right hand 09/08/2018   Abnormal stress test    Shortness of breath    LIMB PAIN 04/24/2010   HYPERTENSION, BENIGN 04/20/2009   MITRAL VALVE DISORDERS 04/20/2009   CARDIOMYOPATHY, SECONDARY 04/20/2009   HYPERLIPIDEMIA 04/16/2009   HYPERTENSION, UNSPECIFIED 04/16/2009   CARDIOMYOPATHY 04/16/2009   PALPITATIONS 04/16/2009   ERECTILE DYSFUNCTION, ORGANIC, HX OF 04/16/2009   BENIGN PROSTATIC HYPERTROPHY, HX OF 04/16/2009      Laureen Abrahams, PT, DPT 02/28/21 12:46 PM     [facility] [address] [place], [place], [address] Phone: [phone]   Fax:  [phone]  Name: Kevin Knight MRN: [id number] Date of Birth: 23-Nov-1935

## 2021-02-28 NOTE — Patient Instructions (Signed)
Access Code: [email or web address] URL: [email or web address] Date: 02/28/2021 Prepared by: Faustino Congress  Exercises Supine Double Knee to Chest - 1 x daily - 7 x weekly - 1 sets - 5 reps - 10-15 sec hold Hooklying Single Knee to Chest - 2 x daily - 7 x weekly - 1 sets - 5 reps - 10-15 sec hold Supine Piriformis Stretch with Foot on Ground - 2 x daily - 7 x weekly - 3 reps - 1 sets - 30 sec hold Supine Lower Trunk Rotation - 2 x daily - 7 x weekly - 1 sets - 5 reps - 10-15 sec hold Supine Bridge - 2 x daily - 7 x weekly - 1 sets - 10 reps - 5 sec hold

## 2021-03-13 ENCOUNTER — Other Ambulatory Visit: Payer: Self-pay

## 2021-03-13 ENCOUNTER — Ambulatory Visit (INDEPENDENT_AMBULATORY_CARE_PROVIDER_SITE_OTHER): Payer: Medicare Other | Admitting: Physical Therapy

## 2021-03-13 DIAGNOSIS — M545 Low back pain, unspecified: Secondary | ICD-10-CM

## 2021-03-13 DIAGNOSIS — M6281 Muscle weakness (generalized): Secondary | ICD-10-CM | POA: Diagnosis not present

## 2021-03-13 DIAGNOSIS — R293 Abnormal posture: Secondary | ICD-10-CM

## 2021-03-13 DIAGNOSIS — G8929 Other chronic pain: Secondary | ICD-10-CM

## 2021-03-13 NOTE — Therapy (Signed)
[facility] Physical Therapy [address] [place], [place], [address] Phone: [phone]   Fax:  [phone]  Physical Therapy Treatment  Patient Details  Name: Kevin Knight MRN: [id number] Date of Birth: Jul 09, 1936 Referring Provider (PT): Leandrew Koyanagi, MD   Encounter Date: 03/13/2021   PT End of Session - 03/13/21 1141     Visit Number 2    Number of Visits 12    Date for PT Re-Evaluation 04/11/21    Authorization Type UHC Medicare    Progress Note Due on Visit 10    PT Start Time 1100    PT Stop Time 1139    PT Time Calculation (min) 39 min    Activity Tolerance Patient tolerated treatment well    Behavior During Therapy [facility] for tasks assessed/performed             Past Medical History:  Diagnosis Date   23-polyvalent pneumococcal polysaccharide vaccine contraindicated as received shingles vaccine within last 4 weeks 2008   SHINGLES SHOT IN 2008   Arthritis    Benign prostatic hypertrophy    history of   ED (erectile dysfunction) of organic origin    history of   Heart murmur    Hyperlipidemia    Hypertension    Neuromuscular disorder ([place])    Other primary cardiomyopathies    Palpitations    Prostate cancer ([place])    Vaccine for streptococcus pneumoniae and influenza 05/12/14   FLUE SHOT 0N 05/12/14 and PNEUMONIA 0N 05/13/14   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[place])     Past Surgical History:  Procedure Laterality Date   CERVICAL FUSION     c5 to c7 posterior   COLON RESECTION  1995   5 inches removed   COLONOSCOPY  01/15/12   EYE SURGERY     LEFT HEART CATH AND CORONARY ANGIOGRAPHY N/A 09/05/2017   Procedure: LEFT HEART CATH AND CORONARY ANGIOGRAPHY;  Surgeon: [name], MD;  Location: [place] [facility];  Service: Cardiovascular;  Laterality: N/A;   LESION REMOVAL Left 03/18/2013   Procedure: DEBRIDEMENT LEFT LONG DISTAL INTERPHALANGEAL JOINT/DEBRIDEMENT DIP JOINT LOOSE BODIES LEFT LONG DIP;  Surgeon: Cammie Sickle., MD;  Location:  [place];  Service: Orthopedics;  Laterality: Left;   PARTIAL COLECTOMY     PROSTATE SURGERY  2005   SMALL INTESTINE SURGERY      There were no vitals filed for this visit.   Subjective Assessment - 03/13/21 1141     Subjective Pt relays about 3-4 out of 10 in his low back, he says he has been compliant with HEP so far.    Pertinent History OA, scoliosis, HTN, hx prostate cancer    Limitations Standing;Walking    Diagnostic tests xrays    Patient Stated Goals improve pain    Pain Onset More than a month ago                               [facility] Adult PT Treatment/Exercise - 03/13/21 0001       Exercises   Exercises Lumbar      Lumbar Exercises: Stretches   Single Knee to Chest Stretch Right;Left;3 reps;20 seconds    Double Knee to Chest Stretch 5 reps;20 seconds    Lower Trunk Rotation 5 reps;10 seconds    Piriformis Stretch Right;Left;3 reps;20 seconds    Other Lumbar Stretch Exercise lumbar extension X 10 reps  Lumbar Exercises: Aerobic   Nustep L6 X 10 min      Lumbar Exercises: Machines for Strengthening   Leg Press 100# DL 3X10, 50# SL 2X15 ea side      Lumbar Exercises: Standing   Row 20 reps    Theraband Level (Row) Level 3 (Green)      Lumbar Exercises: Supine   Clam 20 reps    Clam Limitations green    Bridge 15 reps;5 seconds                      PT Short Term Goals - 02/28/21 1241       PT SHORT TERM GOAL #1   Title independent with initial HEP    Time 3    Period Weeks    Status New    Target Date 03/21/21               PT Long Term Goals - 02/28/21 1242       PT LONG TERM GOAL #1   Title independent with final HEP    Time 6    Period Weeks    Status New    Target Date 04/11/21      PT LONG TERM GOAL #2   Title FOTO score improved to 62 for improved mobility    Time 6    Period Weeks    Status New    Target Date 04/11/21      PT LONG TERM GOAL #3   Title report pain <  4/10 with activity for improved activity tolerance    Time 6    Period Weeks    Status New    Target Date 04/11/21      PT LONG TERM GOAL #4   Title demonstrate 5/5 bil hip strength for improved function    Time 6    Period Weeks    Status New    Target Date 04/11/21                   Plan - 03/13/21 1143     Clinical Impression Statement reviewed HEP with him and he has fair understanding of this but does need cues to slow down with exercises and stretches. He had good overall tolerance to session today, we will continue to work to progress as able to maximize function.    Personal Factors and Comorbidities Comorbidity 3+    Comorbidities OA, scoliosis, HTN, hx prostate cancer    Examination-Activity Limitations Bend;Squat;Transfers;Lift;Locomotion Level    Examination-Participation Restrictions Community Activity    Stability/Clinical Decision Making Evolving/Moderate complexity    Rehab Potential Good    PT Frequency 2x / week    PT Duration 6 weeks    PT Treatment/Interventions ADLs/Self Care Home Management;Cryotherapy;Electrical Stimulation;Moist Heat;Therapeutic exercise;Therapeutic activities;Functional mobility training;Neuromuscular re-education;Patient/family education;Manual techniques;Taping;Dry needling    PT Next Visit Plan review HEP and progress hip/core strength, general mobility    PT Home Exercise Plan Access Code: [email or web address]    Consulted and Agree with Plan of Care Patient             Patient will benefit from skilled therapeutic intervention in order to improve the following deficits and impairments:  Decreased strength, Pain, Difficulty walking, Decreased mobility, Decreased range of motion, Impaired flexibility, Postural dysfunction  Visit Diagnosis: Chronic bilateral low back pain without sciatica  Abnormal posture  Muscle weakness (generalized)     Problem List Patient Active Problem List   Diagnosis Date Noted  Chronic midline low  back pain without sciatica 02/20/2021   Paresthesias in right hand 09/08/2018   Abnormal stress test    Shortness of breath    LIMB PAIN 04/24/2010   HYPERTENSION, BENIGN 04/20/2009   MITRAL VALVE DISORDERS 04/20/2009   CARDIOMYOPATHY, SECONDARY 04/20/2009   HYPERLIPIDEMIA 04/16/2009   HYPERTENSION, UNSPECIFIED 04/16/2009   CARDIOMYOPATHY 04/16/2009   PALPITATIONS 04/16/2009   ERECTILE DYSFUNCTION, ORGANIC, HX OF 04/16/2009   BENIGN PROSTATIC HYPERTROPHY, HX OF 04/16/2009    Debbe Odea, PT,DPT 03/13/2021, 11:46 AM  [facility] [address] [place], [place], [address] Phone: [phone]   Fax:  [phone]  Name: ORIEL KLAUS MRN: [id number] Date of Birth: 1935-08-26

## 2021-03-16 ENCOUNTER — Encounter: Payer: Medicare Other | Admitting: Physical Therapy

## 2021-03-16 ENCOUNTER — Telehealth: Payer: Self-pay | Admitting: Physical Therapy

## 2021-03-16 NOTE — Telephone Encounter (Signed)
Spoke with pt at 11:15 after he did not show for 11:00 appt.  He thought his appt was at 11:45.  Unable to offer later appt time today.  Reminded of next scheduled appt.  Laureen Abrahams, PT, DPT 03/16/21 11:19 AM

## 2021-03-20 ENCOUNTER — Other Ambulatory Visit: Payer: Self-pay

## 2021-03-20 ENCOUNTER — Ambulatory Visit (INDEPENDENT_AMBULATORY_CARE_PROVIDER_SITE_OTHER): Payer: Medicare Other | Admitting: Physical Therapy

## 2021-03-20 ENCOUNTER — Encounter: Payer: Self-pay | Admitting: Physical Therapy

## 2021-03-20 DIAGNOSIS — M545 Low back pain, unspecified: Secondary | ICD-10-CM

## 2021-03-20 DIAGNOSIS — M6281 Muscle weakness (generalized): Secondary | ICD-10-CM

## 2021-03-20 DIAGNOSIS — G8929 Other chronic pain: Secondary | ICD-10-CM

## 2021-03-20 DIAGNOSIS — R293 Abnormal posture: Secondary | ICD-10-CM

## 2021-03-20 NOTE — Therapy (Signed)
[facility] [address] [place], [place], [address] Phone: [phone]   Fax:  [phone]  Physical Therapy Treatment  Patient Details  Name: Kevin Knight MRN: [id number] Date of Birth: Feb 21, 1936 Referring Provider (PT): Leandrew Koyanagi, MD   Encounter Date: 03/20/2021   PT End of Session - 03/20/21 1250     Visit Number 3    Number of Visits 12    Date for PT Re-Evaluation 04/11/21    Authorization Type UHC Medicare    Progress Note Due on Visit 10    PT Start Time 1146    PT Stop Time 1225    PT Time Calculation (min) 39 min    Activity Tolerance Patient tolerated treatment well    Behavior During Therapy [facility] for tasks assessed/performed             Past Medical History:  Diagnosis Date   23-polyvalent pneumococcal polysaccharide vaccine contraindicated as received shingles vaccine within last 4 weeks 2008   SHINGLES SHOT IN 2008   Arthritis    Benign prostatic hypertrophy    history of   ED (erectile dysfunction) of organic origin    history of   Heart murmur    Hyperlipidemia    Hypertension    Neuromuscular disorder ([place])    Other primary cardiomyopathies    Palpitations    Prostate cancer ([employer])    Vaccine for streptococcus pneumoniae and influenza 05/12/14   FLUE SHOT 0N 05/12/14 and PNEUMONIA 0N 05/13/14   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[place])     Past Surgical History:  Procedure Laterality Date   CERVICAL FUSION     c5 to c7 posterior   COLON RESECTION  1995   5 inches removed   COLONOSCOPY  01/15/12   EYE SURGERY     LEFT HEART CATH AND CORONARY ANGIOGRAPHY N/A 09/05/2017   Procedure: LEFT HEART CATH AND CORONARY ANGIOGRAPHY;  Surgeon: [name], MD;  Location: [place] [facility];  Service: Cardiovascular;  Laterality: N/A;   LESION REMOVAL Left 03/18/2013   Procedure: DEBRIDEMENT LEFT LONG DISTAL INTERPHALANGEAL JOINT/DEBRIDEMENT DIP JOINT LOOSE BODIES LEFT LONG DIP;  Surgeon: Cammie Sickle., MD;  Location:  [place];  Service: Orthopedics;  Laterality: Left;   PARTIAL COLECTOMY     PROSTATE SURGERY  2005   SMALL INTESTINE SURGERY      There were no vitals filed for this visit.   Subjective Assessment - 03/20/21 1148     Subjective did his exercises this morning, they seem to be helping.  back is a little sore    Pertinent History OA, scoliosis, HTN, hx prostate cancer    Limitations Standing;Walking    Diagnostic tests xrays    Patient Stated Goals improve pain    Currently in Pain? No/denies                               [facility] Adult PT Treatment/Exercise - 03/20/21 1149       Lumbar Exercises: Stretches   Single Knee to Chest Stretch Right;Left;3 reps;20 seconds    Lower Trunk Rotation 5 reps;10 seconds    Standing Extension 10 reps;5 seconds    Standing Extension Limitations back up to counter      Lumbar Exercises: Aerobic   Nustep L5 X 10 min      Lumbar Exercises: Standing   Heel Raises 20 reps    Row 20 reps  Theraband Level (Row) Level 3 (Green)    Other Standing Lumbar Exercises standing hip abduction and extension x20 reps bil with 2#      Lumbar Exercises: Seated   Long Arc Quad on Chair Both;20 reps;Weights    LAQ on Chair Weights (lbs) 2    LAQ on Chair Limitations sitting edge of high mat table      Lumbar Exercises: Supine   Bridge 20 reps;5 seconds                      PT Short Term Goals - 03/20/21 1250       PT SHORT TERM GOAL #1   Title independent with initial HEP    Time 3    Period Weeks    Status Achieved    Target Date 03/21/21               PT Long Term Goals - 03/20/21 1250       PT LONG TERM GOAL #1   Title independent with final HEP    Time 6    Period Weeks    Status On-going    Target Date 04/11/21      PT LONG TERM GOAL #2   Title FOTO score improved to 62 for improved mobility    Time 6    Period Weeks    Status On-going    Target Date 04/11/21      PT LONG  TERM GOAL #3   Title report pain < 4/10 with activity for improved activity tolerance    Time 6    Period Weeks    Status On-going    Target Date 04/11/21      PT LONG TERM GOAL #4   Title demonstrate 5/5 bil hip strength for improved function    Time 6    Period Weeks    Status On-going    Target Date 04/11/21                   Plan - 03/20/21 1251     Clinical Impression Statement Pt tolerated session well today and has met STG #1 at this time.  He is compliant with his HEP and reports back pain is improving.  More strength focus today which was tolerated well.  Will continue to benefit from PT to maximize function.    Personal Factors and Comorbidities Comorbidity 3+    Comorbidities OA, scoliosis, HTN, hx prostate cancer    Examination-Activity Limitations Bend;Squat;Transfers;Lift;Locomotion Level    Examination-Participation Restrictions Community Activity    Stability/Clinical Decision Making Evolving/Moderate complexity    Rehab Potential Good    PT Frequency 2x / week    PT Duration 6 weeks    PT Treatment/Interventions ADLs/Self Care Home Management;Cryotherapy;Electrical Stimulation;Moist Heat;Therapeutic exercise;Therapeutic activities;Functional mobility training;Neuromuscular re-education;Patient/family education;Manual techniques;Taping;Dry needling    PT Next Visit Plan progress hip/core strength, general mobility    PT Home Exercise Plan Access Code: [email or web address]    Consulted and Agree with Plan of Care Patient             Patient will benefit from skilled therapeutic intervention in order to improve the following deficits and impairments:  Decreased strength, Pain, Difficulty walking, Decreased mobility, Decreased range of motion, Impaired flexibility, Postural dysfunction  Visit Diagnosis: Chronic bilateral low back pain without sciatica  Abnormal posture  Muscle weakness (generalized)     Problem List Patient Active Problem List   Diagnosis  Date Noted   Chronic  midline low back pain without sciatica 02/20/2021   Paresthesias in right hand 09/08/2018   Abnormal stress test    Shortness of breath    LIMB PAIN 04/24/2010   HYPERTENSION, BENIGN 04/20/2009   MITRAL VALVE DISORDERS 04/20/2009   CARDIOMYOPATHY, SECONDARY 04/20/2009   HYPERLIPIDEMIA 04/16/2009   HYPERTENSION, UNSPECIFIED 04/16/2009   CARDIOMYOPATHY 04/16/2009   PALPITATIONS 04/16/2009   ERECTILE DYSFUNCTION, ORGANIC, HX OF 04/16/2009   BENIGN PROSTATIC HYPERTROPHY, HX OF 04/16/2009      Laureen Abrahams, PT, DPT 03/20/21 12:53 PM     [facility] [address] [place], [place], [address] Phone: [phone]   Fax:  [phone]  Name: Kevin Knight MRN: [id number] Date of Birth: [birth year]

## 2021-03-22 ENCOUNTER — Ambulatory Visit (INDEPENDENT_AMBULATORY_CARE_PROVIDER_SITE_OTHER): Payer: Medicare Other | Admitting: Physical Therapy

## 2021-03-22 ENCOUNTER — Other Ambulatory Visit: Payer: Self-pay

## 2021-03-22 ENCOUNTER — Encounter: Payer: Self-pay | Admitting: Physical Therapy

## 2021-03-22 DIAGNOSIS — M545 Low back pain, unspecified: Secondary | ICD-10-CM

## 2021-03-22 DIAGNOSIS — G8929 Other chronic pain: Secondary | ICD-10-CM | POA: Diagnosis not present

## 2021-03-22 DIAGNOSIS — R293 Abnormal posture: Secondary | ICD-10-CM | POA: Diagnosis not present

## 2021-03-22 DIAGNOSIS — M6281 Muscle weakness (generalized): Secondary | ICD-10-CM | POA: Diagnosis not present

## 2021-03-22 NOTE — Therapy (Signed)
[facility] [address] [place], [place], [address] Phone: [phone]   Fax:  [phone]  Physical Therapy Treatment  Patient Details  Name: Kevin Knight MRN: [id number] Date of Birth: [birth year] Referring Provider (PT): Leandrew Koyanagi, MD   Encounter Date: 03/22/2021   PT End of Session - 03/22/21 1236     Visit Number 4    Number of Visits 12    Date for PT Re-Evaluation 04/11/21    Authorization Type UHC Medicare    Progress Note Due on Visit 10    PT Start Time 1144    PT Stop Time 1225    PT Time Calculation (min) 41 min    Activity Tolerance Patient tolerated treatment well    Behavior During Therapy [facility] for tasks assessed/performed             Past Medical History:  Diagnosis Date   23-polyvalent pneumococcal polysaccharide vaccine contraindicated as received shingles vaccine within last 4 weeks 2008   SHINGLES SHOT IN 2008   Arthritis    Benign prostatic hypertrophy    history of   ED (erectile dysfunction) of organic origin    history of   Heart murmur    Hyperlipidemia    Hypertension    Neuromuscular disorder ([place])    Other primary cardiomyopathies    Palpitations    Prostate cancer ([place])    Vaccine for streptococcus pneumoniae and influenza 05/12/14   FLUE SHOT 0N 05/12/14 and PNEUMONIA 0N 05/13/14   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[place])     Past Surgical History:  Procedure Laterality Date   CERVICAL FUSION     c5 to c7 posterior   COLON RESECTION  1995   5 inches removed   COLONOSCOPY  01/15/12   EYE SURGERY     LEFT HEART CATH AND CORONARY ANGIOGRAPHY N/A 09/05/2017   Procedure: LEFT HEART CATH AND CORONARY ANGIOGRAPHY;  Surgeon: [name], MD;  Location: [place] [facility];  Service: Cardiovascular;  Laterality: N/A;   LESION REMOVAL Left 03/18/2013   Procedure: DEBRIDEMENT LEFT LONG DISTAL INTERPHALANGEAL JOINT/DEBRIDEMENT DIP JOINT LOOSE BODIES LEFT LONG DIP;  Surgeon: Cammie Sickle., MD;  Location:  [place];  Service: Orthopedics;  Laterality: Left;   PARTIAL COLECTOMY     PROSTATE SURGERY  2005   SMALL INTESTINE SURGERY      There were no vitals filed for this visit.   Subjective Assessment - 03/22/21 1145     Subjective still has some mild pain on Rt side low back    Pertinent History OA, scoliosis, HTN, hx prostate cancer    Limitations Standing;Walking    Diagnostic tests xrays    Patient Stated Goals improve pain    Currently in Pain? Yes    Pain Score 4     Pain Location Back    Pain Orientation Right;Lower    Pain Descriptors / Indicators Constant;Sore    Pain Type Chronic pain                               [facility] Adult PT Treatment/Exercise - 03/22/21 1147       Lumbar Exercises: Stretches   Single Knee to Chest Stretch Right;Left;3 reps;20 seconds    Standing Extension 10 reps;5 seconds    Standing Extension Limitations back up to counter    Piriformis Stretch Right;Left;3 reps;20 seconds      Lumbar Exercises: Aerobic  Nustep L6 x 10 min      Lumbar Exercises: Standing   Heel Raises 20 reps      Lumbar Exercises: Seated   Long Arc Quad on Chair Both;20 reps;Weights    LAQ on Chair Weights (lbs) 3    LAQ on Chair Limitations sitting edge of high mat table      Lumbar Exercises: Supine   Clam 20 reps    Clam Limitations single limb; L3 band; performed bil    Bridge 20 reps;5 seconds    Bridge Limitations with strap for isometric hip abduction      Lumbar Exercises: Sidelying   Clam Both;20 reps      Manual Therapy   Manual therapy comments STM with compression and percussive device to Rt lumbar paraspinals and QL                      PT Short Term Goals - 03/20/21 1250       PT SHORT TERM GOAL #1   Title independent with initial HEP    Time 3    Period Weeks    Status Achieved    Target Date 03/21/21               PT Long Term Goals - 03/20/21 1250       PT LONG TERM GOAL #1    Title independent with final HEP    Time 6    Period Weeks    Status On-going    Target Date 04/11/21      PT LONG TERM GOAL #2   Title FOTO score improved to 62 for improved mobility    Time 6    Period Weeks    Status On-going    Target Date 04/11/21      PT LONG TERM GOAL #3   Title report pain < 4/10 with activity for improved activity tolerance    Time 6    Period Weeks    Status On-going    Target Date 04/11/21      PT LONG TERM GOAL #4   Title demonstrate 5/5 bil hip strength for improved function    Time 6    Period Weeks    Status On-going    Target Date 04/11/21                   Plan - 03/22/21 1236     Clinical Impression Statement Pt reports centralization of RLE symptoms today and pain isolated to Rt lumbar paraspinals and QL today.  Pain resolved with manual and percussive device.  Will continue to benefit from PT to maximize function.    Personal Factors and Comorbidities Comorbidity 3+    Comorbidities OA, scoliosis, HTN, hx prostate cancer    Examination-Activity Limitations Bend;Squat;Transfers;Lift;Locomotion Level    Examination-Participation Restrictions Community Activity    Stability/Clinical Decision Making Evolving/Moderate complexity    Rehab Potential Good    PT Frequency 2x / week    PT Duration 6 weeks    PT Treatment/Interventions ADLs/Self Care Home Management;Cryotherapy;Electrical Stimulation;Moist Heat;Therapeutic exercise;Therapeutic activities;Functional mobility training;Neuromuscular re-education;Patient/family education;Manual techniques;Taping;Dry needling    PT Next Visit Plan progress hip/core strength, general mobility    PT Home Exercise Plan Access Code: [email or web address]    Consulted and Agree with Plan of Care Patient             Patient will benefit from skilled therapeutic intervention in order to improve the following deficits and impairments:  Decreased strength, Pain, Difficulty walking, Decreased mobility,  Decreased range of motion, Impaired flexibility, Postural dysfunction  Visit Diagnosis: Chronic bilateral low back pain without sciatica  Abnormal posture  Muscle weakness (generalized)     Problem List Patient Active Problem List   Diagnosis Date Noted   Chronic midline low back pain without sciatica 02/20/2021   Paresthesias in right hand 09/08/2018   Abnormal stress test    Shortness of breath    LIMB PAIN 04/24/2010   HYPERTENSION, BENIGN 04/20/2009   MITRAL VALVE DISORDERS 04/20/2009   CARDIOMYOPATHY, SECONDARY 04/20/2009   HYPERLIPIDEMIA 04/16/2009   HYPERTENSION, UNSPECIFIED 04/16/2009   CARDIOMYOPATHY 04/16/2009   PALPITATIONS 04/16/2009   ERECTILE DYSFUNCTION, ORGANIC, HX OF 04/16/2009   BENIGN PROSTATIC HYPERTROPHY, HX OF 04/16/2009      Laureen Abrahams, PT, DPT 03/22/21 12:44 PM      [facility] [address] [place], [place], [address] Phone: [phone]   Fax:  [phone]  Name: Kevin Knight MRN: [id number] Date of Birth: May 22, 1936

## 2021-03-27 ENCOUNTER — Other Ambulatory Visit: Payer: Self-pay

## 2021-03-27 ENCOUNTER — Ambulatory Visit (INDEPENDENT_AMBULATORY_CARE_PROVIDER_SITE_OTHER): Payer: Medicare Other | Admitting: Physical Therapy

## 2021-03-27 ENCOUNTER — Encounter: Payer: Self-pay | Admitting: Physical Therapy

## 2021-03-27 DIAGNOSIS — M6281 Muscle weakness (generalized): Secondary | ICD-10-CM | POA: Diagnosis not present

## 2021-03-27 DIAGNOSIS — R293 Abnormal posture: Secondary | ICD-10-CM | POA: Diagnosis not present

## 2021-03-27 DIAGNOSIS — M545 Low back pain, unspecified: Secondary | ICD-10-CM

## 2021-03-27 DIAGNOSIS — G8929 Other chronic pain: Secondary | ICD-10-CM

## 2021-03-27 NOTE — Therapy (Signed)
[facility] [address] [place], [place], [address] Phone: [phone]   Fax:  [phone]  Physical Therapy Treatment  Patient Details  Name: Kevin Knight MRN: [id number] Date of Birth: 04-Feb-1936 Referring Provider (PT): Leandrew Koyanagi, MD   Encounter Date: 03/27/2021   PT End of Session - 03/27/21 1251     Visit Number 5    Number of Visits 12    Date for PT Re-Evaluation 04/11/21    Authorization Type UHC Medicare    Progress Note Due on Visit 10    PT Start Time 1144    PT Stop Time 1227    PT Time Calculation (min) 43 min    Activity Tolerance Patient tolerated treatment well    Behavior During Therapy [facility] for tasks assessed/performed             Past Medical History:  Diagnosis Date   23-polyvalent pneumococcal polysaccharide vaccine contraindicated as received shingles vaccine within last 4 weeks 2008   SHINGLES SHOT IN 2008   Arthritis    Benign prostatic hypertrophy    history of   ED (erectile dysfunction) of organic origin    history of   Heart murmur    Hyperlipidemia    Hypertension    Neuromuscular disorder ([place])    Other primary cardiomyopathies    Palpitations    Prostate cancer ([place])    Vaccine for streptococcus pneumoniae and influenza 05/12/14   FLUE SHOT 0N 05/12/14 and PNEUMONIA 0N 05/13/14   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[place])     Past Surgical History:  Procedure Laterality Date   CERVICAL FUSION     c5 to c7 posterior   COLON RESECTION  1995   5 inches removed   COLONOSCOPY  01/15/12   EYE SURGERY     LEFT HEART CATH AND CORONARY ANGIOGRAPHY N/A 09/05/2017   Procedure: LEFT HEART CATH AND CORONARY ANGIOGRAPHY;  Surgeon: [name], MD;  Location: [place] [facility];  Service: Cardiovascular;  Laterality: N/A;   LESION REMOVAL Left 03/18/2013   Procedure: DEBRIDEMENT LEFT LONG DISTAL INTERPHALANGEAL JOINT/DEBRIDEMENT DIP JOINT LOOSE BODIES LEFT LONG DIP;  Surgeon: Cammie Sickle., MD;  Location:  [place];  Service: Orthopedics;  Laterality: Left;   PARTIAL COLECTOMY     PROSTATE SURGERY  2005   SMALL INTESTINE SURGERY      There were no vitals filed for this visit.   Subjective Assessment - 03/27/21 1146     Subjective still having a hard time standing for 5 min, reports he was trimming a tree yesterday and having some pain in his upper back that radiated down to low back    Pertinent History OA, scoliosis, HTN, hx prostate cancer    Limitations Standing;Walking    Diagnostic tests xrays    Patient Stated Goals improve pain    Currently in Pain? Yes    Pain Score 6     Pain Location Back    Pain Orientation Mid;Lower    Pain Descriptors / Indicators Sore;Constant    Pain Type Chronic pain    Pain Onset More than a month ago    Pain Frequency Intermittent    Aggravating Factors  walking, standing    Pain Relieving Factors rest, tylenol                               [facility] Adult PT Treatment/Exercise - 03/27/21 1148  Lumbar Exercises: Stretches   Passive Hamstring Stretch Right;Left;3 reps;30 seconds    Passive Hamstring Stretch Limitations seated    Quadruped Mid Back Stretch Limitations seated mid back stretch 3x20 sec mid/Lt/Rt    Piriformis Stretch Right;Left;3 reps;20 seconds    Piriformis Stretch Limitations seated      Lumbar Exercises: Aerobic   Nustep L7 x 10 min      Lumbar Exercises: Machines for Strengthening   Leg Press 125# DL 3x10; 62# 3x10 performed SL bil      Lumbar Exercises: Standing   Heel Raises --   3x10   Functional Squats --   3x10 with UE support     Lumbar Exercises: Seated   Sit to Stand 20 reps    Sit to Stand Limitations initially needing UE support progressing to none      Manual Therapy   Manual therapy comments STM with compression and percussive device to bil paraspinals and QL/ rhomboids; grade 2 CPA mobs T 8-L4/5                      PT Short Term Goals - 03/20/21  1250       PT SHORT TERM GOAL #1   Title independent with initial HEP    Time 3    Period Weeks    Status Achieved    Target Date 03/21/21               PT Long Term Goals - 03/20/21 1250       PT LONG TERM GOAL #1   Title independent with final HEP    Time 6    Period Weeks    Status On-going    Target Date 04/11/21      PT LONG TERM GOAL #2   Title FOTO score improved to 62 for improved mobility    Time 6    Period Weeks    Status On-going    Target Date 04/11/21      PT LONG TERM GOAL #3   Title report pain < 4/10 with activity for improved activity tolerance    Time 6    Period Weeks    Status On-going    Target Date 04/11/21      PT LONG TERM GOAL #4   Title demonstrate 5/5 bil hip strength for improved function    Time 6    Period Weeks    Status On-going    Target Date 04/11/21                   Plan - 03/27/21 1251     Clinical Impression Statement Pt tolerated session well today reporting decreased pain following session.  Will continue to benefit from PT to maximize function.    Personal Factors and Comorbidities Comorbidity 3+    Comorbidities OA, scoliosis, HTN, hx prostate cancer    Examination-Activity Limitations Bend;Squat;Transfers;Lift;Locomotion Level    Examination-Participation Restrictions Community Activity    Stability/Clinical Decision Making Evolving/Moderate complexity    Rehab Potential Good    PT Frequency 2x / week    PT Duration 6 weeks    PT Treatment/Interventions ADLs/Self Care Home Management;Cryotherapy;Electrical Stimulation;Moist Heat;Therapeutic exercise;Therapeutic activities;Functional mobility training;Neuromuscular re-education;Patient/family education;Manual techniques;Taping;Dry needling    PT Next Visit Plan progress hip/core strength, general mobility, add strengtening to HEP    PT Home Exercise Plan Access Code: [email or web address]    Consulted and Agree with Plan of Care Patient  Patient  will benefit from skilled therapeutic intervention in order to improve the following deficits and impairments:  Decreased strength, Pain, Difficulty walking, Decreased mobility, Decreased range of motion, Impaired flexibility, Postural dysfunction  Visit Diagnosis: Chronic bilateral low back pain without sciatica  Abnormal posture  Muscle weakness (generalized)     Problem List Patient Active Problem List   Diagnosis Date Noted   Chronic midline low back pain without sciatica 02/20/2021   Paresthesias in right hand 09/08/2018   Abnormal stress test    Shortness of breath    LIMB PAIN 04/24/2010   HYPERTENSION, BENIGN 04/20/2009   MITRAL VALVE DISORDERS 04/20/2009   CARDIOMYOPATHY, SECONDARY 04/20/2009   HYPERLIPIDEMIA 04/16/2009   HYPERTENSION, UNSPECIFIED 04/16/2009   CARDIOMYOPATHY 04/16/2009   PALPITATIONS 04/16/2009   ERECTILE DYSFUNCTION, ORGANIC, HX OF 04/16/2009   BENIGN PROSTATIC HYPERTROPHY, HX OF 04/16/2009       Laureen Abrahams, PT, DPT 03/27/21 12:53 PM    [facility] [address] [place], [place], [address] Phone: [phone]   Fax:  [phone]  Name: Kevin Knight MRN: [id number] Date of Birth: [birth year]

## 2021-03-29 ENCOUNTER — Ambulatory Visit (INDEPENDENT_AMBULATORY_CARE_PROVIDER_SITE_OTHER): Payer: Medicare Other | Admitting: Rehabilitative and Restorative Service Providers"

## 2021-03-29 ENCOUNTER — Encounter: Payer: Self-pay | Admitting: Rehabilitative and Restorative Service Providers"

## 2021-03-29 ENCOUNTER — Other Ambulatory Visit: Payer: Self-pay

## 2021-03-29 DIAGNOSIS — G8929 Other chronic pain: Secondary | ICD-10-CM | POA: Diagnosis not present

## 2021-03-29 DIAGNOSIS — M545 Low back pain, unspecified: Secondary | ICD-10-CM

## 2021-03-29 DIAGNOSIS — M6281 Muscle weakness (generalized): Secondary | ICD-10-CM | POA: Diagnosis not present

## 2021-03-29 DIAGNOSIS — R293 Abnormal posture: Secondary | ICD-10-CM

## 2021-03-29 NOTE — Therapy (Signed)
[facility] [place], [place], [address] Phone: [phone]   Fax:  [phone]  Physical Therapy Treatment  Patient Details  Name: Kevin Knight MRN: [id number] Date of Birth: [birth year] Referring Provider (PT): Leandrew Koyanagi, MD   Encounter Date: 03/29/2021   PT End of Session - 03/29/21 1239     Visit Number 6    Number of Visits 12    Date for PT Re-Evaluation 04/11/21    Authorization Type UHC Medicare    Progress Note Due on Visit 10    PT Start Time 1145    PT Stop Time 1226    PT Time Calculation (min) 41 min    Activity Tolerance Patient tolerated treatment well;No increased pain    Behavior During Therapy [facility] for tasks assessed/performed             Past Medical History:  Diagnosis Date   23-polyvalent pneumococcal polysaccharide vaccine contraindicated as received shingles vaccine within last 4 weeks 2008   SHINGLES SHOT IN 2008   Arthritis    Benign prostatic hypertrophy    history of   ED (erectile dysfunction) of organic origin    history of   Heart murmur    Hyperlipidemia    Hypertension    Neuromuscular disorder ([place])    Other primary cardiomyopathies    Palpitations    Prostate cancer ([place])    Vaccine for streptococcus pneumoniae and influenza 05/12/14   FLUE SHOT 0N 05/12/14 and PNEUMONIA 0N 05/13/14   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[place])     Past Surgical History:  Procedure Laterality Date   CERVICAL FUSION     c5 to c7 posterior   COLON RESECTION  1995   5 inches removed   COLONOSCOPY  01/15/12   EYE SURGERY     LEFT HEART CATH AND CORONARY ANGIOGRAPHY N/A 09/05/2017   Procedure: LEFT HEART CATH AND CORONARY ANGIOGRAPHY;  Surgeon: [name], MD;  Location: [facility];  Service: Cardiovascular;  Laterality: N/A;   LESION REMOVAL Left 03/18/2013   Procedure: DEBRIDEMENT LEFT LONG DISTAL INTERPHALANGEAL JOINT/DEBRIDEMENT DIP JOINT LOOSE BODIES LEFT LONG DIP;  Surgeon: Cammie Sickle.,  MD;  Location: [place];  Service: Orthopedics;  Laterality: Left;   PARTIAL COLECTOMY     PROSTATE SURGERY  2005   SMALL INTESTINE SURGERY      There were no vitals filed for this visit.   Subjective Assessment - 03/29/21 1235     Subjective Kevin Knight reports good HEP compliance.  He did have some "good" soreness with leg press last visit.    Pertinent History OA, scoliosis, HTN, hx prostate cancer    Limitations Standing;Walking    Diagnostic tests xrays    Patient Stated Goals improve pain    Currently in Pain? Yes    Pain Score 5     Pain Location Back    Pain Orientation Mid;Lower    Pain Descriptors / Indicators Sore;Constant    Pain Type Chronic pain    Pain Radiating Towards NA    Pain Onset More than a month ago    Pain Frequency Intermittent    Aggravating Factors  Poor posture, standing and walking    Pain Relieving Factors Taking a break to rest or correct posture    Effect of Pain on Daily Activities Has to take frequent breaks to change posture/position    Multiple Pain Sites No  [facility] PT Treatment/Exercise - 03/29/21 0001       Therapeutic Activites    Therapeutic Activities ADL's    ADL's Worked on standing and seated posture, correct lap top computer set-up      Exercises   Exercises Lumbar      Lumbar Exercises: Stretches   Active Hamstring Stretch Left;Right;5 reps;20 seconds;Limitations    Active Hamstring Stretch Limitations 2 sets with other leg straight    Single Knee to Chest Stretch Left;Right;3 reps;20 seconds    Lower Trunk Rotation 5 reps;10 seconds    Standing Extension 10 reps;Limitations    Standing Extension Limitations 3 sets 3 seconds      Lumbar Exercises: Standing   Other Standing Lumbar Exercises Shoulder blade pinches 2 sets of 10 for 5 seconds      Lumbar Exercises: Supine   Bridge 10 reps;5 seconds;Limitations    Bridge Limitations 2 sets      Lumbar  Exercises: Prone   Straight Leg Raise 10 reps;3 seconds                    PT Education - 03/29/21 1237     Education Details Spent time discussing standing and seated posture, spine anatomy and lap top computer set-up and posture to avoid scapular and back pain.    Person(s) Educated Patient    Methods Explanation;Demonstration;Verbal cues;Tactile cues;Handout    Comprehension Verbal cues required;Returned demonstration;Need further instruction;Tactile cues required;Verbalized understanding              PT Short Term Goals - 03/20/21 1250       PT SHORT TERM GOAL #1   Title independent with initial HEP    Time 3    Period Weeks    Status Achieved    Target Date 03/21/21               PT Long Term Goals - 03/29/21 1238       PT LONG TERM GOAL #1   Title independent with final HEP    Time 6    Period Weeks    Status On-going    Target Date 04/11/21      PT LONG TERM GOAL #2   Title FOTO score improved to 62 for improved mobility    Time 6    Period Weeks    Status On-going    Target Date 04/11/21      PT LONG TERM GOAL #3   Title report pain < 4/10 with activity for improved activity tolerance    Time 6    Period Weeks    Status On-going    Target Date 04/11/21      PT LONG TERM GOAL #4   Title demonstrate 5/5 bil hip strength for improved function    Time 6    Period Weeks    Status On-going    Target Date 04/11/21                   Plan - 03/29/21 1239     Clinical Impression Statement Kevin Knight has a strong postural component to his back pain.  We reviewed postural and spine education and basic standing exercises (trunk extension and shoulder blade pinches) he can do throughout the day to correct posture and avoid having to sit to relieve back pain.  Continued postural, spine and LE strength work will help Kevin Knight.    Personal Factors and Comorbidities Comorbidity 3+    Comorbidities OA,  scoliosis, HTN, hx prostate  cancer    Examination-Activity Limitations Bend;Squat;Transfers;Lift;Locomotion Level    Examination-Participation Restrictions Community Activity    Stability/Clinical Decision Making Evolving/Moderate complexity    Rehab Potential Good    PT Frequency 2x / week    PT Duration 6 weeks    PT Treatment/Interventions ADLs/Self Care Home Management;Cryotherapy;Electrical Stimulation;Moist Heat;Therapeutic exercise;Therapeutic activities;Functional mobility training;Neuromuscular re-education;Patient/family education;Manual techniques;Taping;Dry needling    PT Next Visit Plan Continue body mechanics work, spine and LE strength progressions (ran out of time for leg strength today)    PT Home Exercise Plan [email or web address]    Consulted and Agree with Plan of Care Patient             Patient will benefit from skilled therapeutic intervention in order to improve the following deficits and impairments:  Decreased strength, Pain, Difficulty walking, Decreased mobility, Decreased range of motion, Impaired flexibility, Postural dysfunction  Visit Diagnosis: Chronic bilateral low back pain without sciatica  Abnormal posture  Muscle weakness (generalized)     Problem List Patient Active Problem List   Diagnosis Date Noted   Chronic midline low back pain without sciatica 02/20/2021   Paresthesias in right hand 09/08/2018   Abnormal stress test    Shortness of breath    LIMB PAIN 04/24/2010   HYPERTENSION, BENIGN 04/20/2009   MITRAL VALVE DISORDERS 04/20/2009   CARDIOMYOPATHY, SECONDARY 04/20/2009   HYPERLIPIDEMIA 04/16/2009   HYPERTENSION, UNSPECIFIED 04/16/2009   CARDIOMYOPATHY 04/16/2009   PALPITATIONS 04/16/2009   ERECTILE DYSFUNCTION, ORGANIC, HX OF 04/16/2009   BENIGN PROSTATIC HYPERTROPHY, HX OF 04/16/2009    Farley Ly PT, MPT 03/29/2021, 12:43 PM  [facility] [address] [place], [place], [address] Phone: [phone]   Fax:   [phone]  Name: ERLAND NAGAR MRN: [id number] Date of Birth: 20-Feb-1936

## 2021-03-29 NOTE — Patient Instructions (Signed)
Access Code: [email or web address] URL: [email or web address] Date: 03/29/2021 Prepared by: Vista Mink  Exercises  Supine Lower Trunk Rotation - 2 x daily - 7 x weekly - 1 sets - 5 reps - 10-15 sec hold Supine Bridge - 2 x daily - 7 x weekly - 1 sets - 10-20 reps - 5-10 sec hold Standing Scapular Retraction - 5 x daily - 7 x weekly - 1 sets - 5 reps - 5 second hold Standing Lumbar Extension at [place] - 5 x daily - 7 x weekly - 1 sets - 5 reps - 3 seconds hold Supine Hamstring Stretch - 2 x daily - 7 x weekly - 1 sets - 5 reps - 20 seconds hold

## 2021-04-03 ENCOUNTER — Encounter: Payer: Self-pay | Admitting: Physical Therapy

## 2021-04-03 ENCOUNTER — Other Ambulatory Visit: Payer: Self-pay

## 2021-04-03 ENCOUNTER — Ambulatory Visit (INDEPENDENT_AMBULATORY_CARE_PROVIDER_SITE_OTHER): Payer: Medicare Other | Admitting: Physical Therapy

## 2021-04-03 DIAGNOSIS — R293 Abnormal posture: Secondary | ICD-10-CM | POA: Diagnosis not present

## 2021-04-03 DIAGNOSIS — G8929 Other chronic pain: Secondary | ICD-10-CM | POA: Diagnosis not present

## 2021-04-03 DIAGNOSIS — M545 Low back pain, unspecified: Secondary | ICD-10-CM

## 2021-04-03 DIAGNOSIS — M6281 Muscle weakness (generalized): Secondary | ICD-10-CM | POA: Diagnosis not present

## 2021-04-03 NOTE — Patient Instructions (Signed)
Access Code: [email or web address] URL: [email or web address] Date: 04/03/2021 Prepared by: Faustino Congress  Exercises Supine Double Knee to Chest - 1 x daily - 7 x weekly - 1 sets - 5 reps - 10-15 sec hold Hooklying Single Knee to Chest - 2 x daily - 7 x weekly - 1 sets - 5 reps - 10-15 sec hold Supine Piriformis Stretch with Foot on Ground - 2 x daily - 7 x weekly - 3 reps - 1 sets - 30 sec hold Supine Lower Trunk Rotation - 2 x daily - 7 x weekly - 1 sets - 5 reps - 10-15 sec hold Supine Bridge - 2 x daily - 7 x weekly - 1 sets - 10-20 reps - 5-10 sec hold Standing Scapular Retraction - 5 x daily - 7 x weekly - 1 sets - 5 reps - 5 second hold Standing Lumbar Extension at Wall - Forearms - 5 x daily - 7 x weekly - 1 sets - 5 reps - 3 seconds hold Supine Hamstring Stretch - 2 x daily - 7 x weekly - 1 sets - 5 reps - 20 seconds hold Sit to Stand - 2 x daily - 7 x weekly - 2 sets - 10 reps Heel Raises with Unilateral Counter Support - 1 x daily - 7 x weekly - 1 sets - 20 reps Standing Hip Abduction with Counter Support - 1 x daily - 7 x weekly - 20 reps - 1 sets Standing Hip Extension with Counter Support - 1 x daily - 7 x weekly - 20 reps - 1 sets

## 2021-04-03 NOTE — Therapy (Signed)
[place], [place], [address] Phone: [phone]   Fax:  [phone]  Physical Therapy Treatment  Patient Details  Name: Kevin Knight MRN: [id number] Date of Birth: Mar 04, 1936 Referring Provider (PT): Leandrew Koyanagi, MD   Encounter Date: 04/03/2021   PT End of Session - 04/03/21 1211     Visit Number 7    Number of Visits 12    Date for PT Re-Evaluation 04/11/21    Authorization Type UHC Medicare    Progress Note Due on Visit 10    PT Start Time 1147    PT Stop Time 1225    PT Time Calculation (min) 38 min    Activity Tolerance Patient tolerated treatment well;No increased pain    Behavior During Therapy WFL for tasks assessed/performed             Past Medical History:  Diagnosis Date   23-polyvalent pneumococcal polysaccharide vaccine contraindicated as received shingles vaccine within last 4 weeks 2008   SHINGLES SHOT IN 2008   Arthritis    Benign prostatic hypertrophy    history of   ED (erectile dysfunction) of organic origin    history of   Heart murmur    Hyperlipidemia    Hypertension    Neuromuscular disorder ([place])    Other primary cardiomyopathies    Palpitations    Prostate cancer ([place])    Vaccine for streptococcus pneumoniae and influenza 05/12/14   FLUE SHOT 0N 05/12/14 and PNEUMONIA 0N 05/13/14   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[place])     Past Surgical History:  Procedure Laterality Date   CERVICAL FUSION     c5 to c7 posterior   COLON RESECTION  1995   5 inches removed   COLONOSCOPY  01/15/12   EYE SURGERY     LEFT HEART CATH AND CORONARY ANGIOGRAPHY N/A 09/05/2017   Procedure: LEFT HEART CATH AND CORONARY ANGIOGRAPHY;  Surgeon: [name], MD;  Location: [facility];  Service: Cardiovascular;  Laterality: N/A;   LESION REMOVAL Left 03/18/2013   Procedure: DEBRIDEMENT LEFT LONG DISTAL INTERPHALANGEAL JOINT/DEBRIDEMENT DIP JOINT LOOSE BODIES LEFT LONG DIP;  Surgeon: Cammie Sickle.,  MD;  Location: [facility];  Service: Orthopedics;  Laterality: Left;   PARTIAL COLECTOMY     PROSTATE SURGERY  2005   SMALL INTESTINE SURGERY      There were no vitals filed for this visit.   Subjective Assessment - 04/03/21 1150     Subjective doing well; feels ready to d/c this week.    Pertinent History OA, scoliosis, HTN, hx prostate cancer    Limitations Standing;Walking    Diagnostic tests xrays    Patient Stated Goals improve pain    Currently in Pain? No/denies                [facility] PT Assessment - 04/03/21 0001       Assessment   Medical Diagnosis M54.50,G89.29 (ICD-10-CM) - Chronic midline low back pain without sciatica    Referring Provider (PT) Leandrew Koyanagi, MD      Observation/Other Assessments   Focus on Therapeutic Outcomes (FOTO)  65                           [facility] Adult PT Treatment/Exercise - 04/03/21 1151       Lumbar Exercises: Stretches   Passive Hamstring Stretch Right;Left;3 reps;30 seconds    Passive Hamstring Stretch Limitations  seated      Lumbar Exercises: Aerobic   Nustep L7 x 10 min      Lumbar Exercises: Machines for Strengthening   Leg Press 125# DL 3x10; 62# 3x10 performed SL bil      Lumbar Exercises: Standing   Heel Raises 20 reps    Scapular Retraction Both;20 reps    Other Standing Lumbar Exercises standing hip abduction and extension x20 reps bil      Lumbar Exercises: Seated   Sit to Stand 20 reps    Sit to Stand Limitations initially needing UE support progressing to none                    PT Education - 04/03/21 1216     Education Details added strengthening to HEP - perform 4-5x/wk    Person(s) Educated Patient    Methods Explanation;Demonstration;Handout    Comprehension Verbalized understanding;Returned demonstration              PT Short Term Goals - 03/20/21 1250       PT SHORT TERM GOAL #1   Title independent with initial HEP    Time 3    Period Weeks     Status Achieved    Target Date 03/21/21               PT Long Term Goals - 04/03/21 1221       PT LONG TERM GOAL #1   Title independent with final HEP    Time 6    Period Weeks    Status Achieved      PT LONG TERM GOAL #2   Title FOTO score improved to 62 for improved mobility    Time 6    Period Weeks    Status Achieved      PT LONG TERM GOAL #3   Title report pain < 4/10 with activity for improved activity tolerance    Time 6    Period Weeks    Status Achieved      PT LONG TERM GOAL #4   Title demonstrate 5/5 bil hip strength for improved function    Time 6    Period Weeks    Status On-going                   Plan - 04/03/21 1222     Clinical Impression Statement Pt has met all assessed LTGs at this time and anticipate he will be ready to d/c from PT next visit.  Will continue to benefit from PT to finalize HEP and maximize function.    Personal Factors and Comorbidities Comorbidity 3+    Comorbidities OA, scoliosis, HTN, hx prostate cancer    Examination-Activity Limitations Bend;Squat;Transfers;Lift;Locomotion Level    Examination-Participation Restrictions Community Activity    Stability/Clinical Decision Making Evolving/Moderate complexity    Rehab Potential Good    PT Frequency 2x / week    PT Duration 6 weeks    PT Treatment/Interventions ADLs/Self Care Home Management;Cryotherapy;Electrical Stimulation;Moist Heat;Therapeutic exercise;Therapeutic activities;Functional mobility training;Neuromuscular re-education;Patient/family education;Manual techniques;Taping;Dry needling    PT Next Visit Plan check remaining goals, plan for d/c    PT Home Exercise Plan [email or web address]    Consulted and Agree with Plan of Care Patient             Patient will benefit from skilled therapeutic intervention in order to improve the following deficits and impairments:  Decreased strength, Pain, Difficulty walking, Decreased mobility, Decreased range of motion,  Impaired  flexibility, Postural dysfunction  Visit Diagnosis: Chronic bilateral low back pain without sciatica  Abnormal posture  Muscle weakness (generalized)     Problem List Patient Active Problem List   Diagnosis Date Noted   Chronic midline low back pain without sciatica 02/20/2021   Paresthesias in right hand 09/08/2018   Abnormal stress test    Shortness of breath    LIMB PAIN 04/24/2010   HYPERTENSION, BENIGN 04/20/2009   MITRAL VALVE DISORDERS 04/20/2009   CARDIOMYOPATHY, SECONDARY 04/20/2009   HYPERLIPIDEMIA 04/16/2009   HYPERTENSION, UNSPECIFIED 04/16/2009   CARDIOMYOPATHY 04/16/2009   PALPITATIONS 04/16/2009   ERECTILE DYSFUNCTION, ORGANIC, HX OF 04/16/2009   BENIGN PROSTATIC HYPERTROPHY, HX OF 04/16/2009      Laureen Abrahams, PT, DPT 04/03/21 12:57 PM     [facility] [address] [place], [place], [address] Phone: [phone]   Fax:  [phone]  Name: HEBERT DOOLING MRN: [id number] Date of Birth: 02-Aug-1936

## 2021-04-05 ENCOUNTER — Encounter: Payer: Self-pay | Admitting: Physical Therapy

## 2021-04-05 ENCOUNTER — Other Ambulatory Visit: Payer: Self-pay

## 2021-04-05 ENCOUNTER — Ambulatory Visit (INDEPENDENT_AMBULATORY_CARE_PROVIDER_SITE_OTHER): Payer: Medicare Other | Admitting: Physical Therapy

## 2021-04-05 DIAGNOSIS — M6281 Muscle weakness (generalized): Secondary | ICD-10-CM

## 2021-04-05 DIAGNOSIS — G8929 Other chronic pain: Secondary | ICD-10-CM | POA: Diagnosis not present

## 2021-04-05 DIAGNOSIS — M545 Low back pain, unspecified: Secondary | ICD-10-CM | POA: Diagnosis not present

## 2021-04-05 DIAGNOSIS — R293 Abnormal posture: Secondary | ICD-10-CM | POA: Diagnosis not present

## 2021-04-05 NOTE — Therapy (Signed)
[place], [place], [address] Phone: [phone]   Fax:  [phone]  Physical Therapy Treatment/Discharge Summary  Patient Details  Name: Kevin Knight MRN: [id number] Date of Birth: [birth year] Referring Provider (PT): Leandrew Koyanagi, MD   Encounter Date: 04/05/2021   PT End of Session - 04/05/21 1149     Visit Number 8    Date for PT Re-Evaluation 04/11/21    Authorization Type UHC Medicare    PT Start Time 2353    PT Stop Time 1209    PT Time Calculation (min) 24 min    Activity Tolerance Patient tolerated treatment well;No increased pain    Behavior During Therapy WFL for tasks assessed/performed             Past Medical History:  Diagnosis Date   23-polyvalent pneumococcal polysaccharide vaccine contraindicated as received shingles vaccine within last 4 weeks 2008   SHINGLES SHOT IN 2008   Arthritis    Benign prostatic hypertrophy    history of   ED (erectile dysfunction) of organic origin    history of   Heart murmur    Hyperlipidemia    Hypertension    Neuromuscular disorder ([place])    Other primary cardiomyopathies    Palpitations    Prostate cancer ([place])    Vaccine for streptococcus pneumoniae and influenza 05/12/14   FLUE SHOT 0N 05/12/14 and PNEUMONIA 0N 05/13/14   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[place])     Past Surgical History:  Procedure Laterality Date   CERVICAL FUSION     c5 to c7 posterior   COLON RESECTION  1995   5 inches removed   COLONOSCOPY  01/15/12   EYE SURGERY     LEFT HEART CATH AND CORONARY ANGIOGRAPHY N/A 09/05/2017   Procedure: LEFT HEART CATH AND CORONARY ANGIOGRAPHY;  Surgeon: [name], MD;  Location: [facility];  Service: Cardiovascular;  Laterality: N/A;   LESION REMOVAL Left 03/18/2013   Procedure: DEBRIDEMENT LEFT LONG DISTAL INTERPHALANGEAL JOINT/DEBRIDEMENT DIP JOINT LOOSE BODIES LEFT LONG DIP;  Surgeon: Cammie Sickle., MD;  Location: [facility];  Service: Orthopedics;  Laterality: Left;   PARTIAL COLECTOMY     PROSTATE SURGERY  2005   SMALL INTESTINE SURGERY      There were no vitals filed for this visit.   Subjective Assessment - 04/05/21 1147     Subjective still ready to d/c today.    Pertinent History OA, scoliosis, HTN, hx prostate cancer    Limitations Standing;Walking    Diagnostic tests xrays    Patient Stated Goals improve pain    Currently in Pain? No/denies                [facility] PT Assessment - 04/05/21 1149       Assessment   Medical Diagnosis M54.50,G89.29 (ICD-10-CM) - Chronic midline low back pain without sciatica    Referring Provider (PT) Leandrew Koyanagi, MD      Observation/Other Assessments   Focus on Therapeutic Outcomes (FOTO)  70      Strength   Right Hip Flexion 5/5    Right Hip ABduction 5/5    Left Hip Flexion 5/5    Left Hip ABduction 5/5                           [facility] Adult PT Treatment/Exercise - 04/05/21 1148       Lumbar Exercises: Aerobic  Nustep L7 x 10 min      Lumbar Exercises: Seated   Long Arc Quad on Chair Both;3 sets;10 reps;Weights    LAQ on Chair Weights (lbs) 5    LAQ on Chair Limitations sitting edge of high mat table    Hip Flexion on Ball Limitations sitting edge of mat 3x10 bil 5#                      PT Short Term Goals - 03/20/21 1250       PT SHORT TERM GOAL #1   Title independent with initial HEP    Time 3    Period Weeks    Status Achieved    Target Date 03/21/21               PT Long Term Goals - 04/05/21 1211       PT LONG TERM GOAL #1   Title independent with final HEP    Time 6    Period Weeks    Status Achieved      PT LONG TERM GOAL #2   Title FOTO score improved to 62 for improved mobility    Time 6    Period Weeks    Status Achieved      PT LONG TERM GOAL #3   Title report pain < 4/10 with activity for improved activity tolerance    Time 6    Period Weeks    Status Achieved       PT LONG TERM GOAL #4   Title demonstrate 5/5 bil hip strength for improved function    Time 6    Period Weeks    Status Achieved                   Plan - 04/05/21 1212     Clinical Impression Statement Pt has met all goals and is ready for d/c from PT today.  Will d/c PT.    Personal Factors and Comorbidities Comorbidity 3+    Comorbidities OA, scoliosis, HTN, hx prostate cancer    Examination-Activity Limitations Bend;Squat;Transfers;Lift;Locomotion Level    Examination-Participation Restrictions Community Activity    Stability/Clinical Decision Making Evolving/Moderate complexity    Rehab Potential Good    PT Frequency 2x / week    PT Duration 6 weeks    PT Treatment/Interventions ADLs/Self Care Home Management;Cryotherapy;Electrical Stimulation;Moist Heat;Therapeutic exercise;Therapeutic activities;Functional mobility training;Neuromuscular re-education;Patient/family education;Manual techniques;Taping;Dry needling    PT Next Visit Plan d/c PT today    PT Home Exercise Plan [id number]    Consulted and Agree with Plan of Care Patient             Patient will benefit from skilled therapeutic intervention in order to improve the following deficits and impairments:  Decreased strength, Pain, Difficulty walking, Decreased mobility, Decreased range of motion, Impaired flexibility, Postural dysfunction  Visit Diagnosis: Chronic bilateral low back pain without sciatica  Abnormal posture  Muscle weakness (generalized)     Problem List Patient Active Problem List   Diagnosis Date Noted   Chronic midline low back pain without sciatica 02/20/2021   Paresthesias in right hand 09/08/2018   Abnormal stress test    Shortness of breath    LIMB PAIN 04/24/2010   HYPERTENSION, BENIGN 04/20/2009   MITRAL VALVE DISORDERS 04/20/2009   CARDIOMYOPATHY, SECONDARY 04/20/2009   HYPERLIPIDEMIA 04/16/2009   HYPERTENSION, UNSPECIFIED 04/16/2009   CARDIOMYOPATHY 04/16/2009    PALPITATIONS 04/16/2009   ERECTILE DYSFUNCTION, ORGANIC, HX OF 04/16/2009  BENIGN PROSTATIC HYPERTROPHY, HX OF 04/16/2009     Laureen Abrahams, PT, DPT 04/05/21 12:13 PM    [facility] [address] [place], [place], [address] Phone: [phone]   Fax:  [phone]  Name: Kevin Knight MRN: [id number] Date of Birth: 1935/08/28      PHYSICAL THERAPY DISCHARGE SUMMARY  Visits from Start of Care: 8  Current functional level related to goals / functional outcomes: See above   Remaining deficits: See above   Education / Equipment: HEP   Patient agrees to discharge. Patient goals were met. Patient is being discharged due to meeting the stated rehab goals.  Laureen Abrahams, PT, DPT 04/05/21 12:13 PM  [facility] [address] [place], [place], [address] Phone: [phone]   Fax:  [phone]

## 2021-05-01 ENCOUNTER — Telehealth: Payer: Self-pay | Admitting: Cardiovascular Disease

## 2021-05-01 NOTE — Telephone Encounter (Signed)
Pt c/o Shortness Of Breath: STAT if SOB developed within the last 24 hours or pt is noticeably SOB on the phone  1. Are you currently SOB (can you hear that pt is SOB on the phone)?  No   2. How long have you been experiencing SOB?  Over 1 month, per patient  3. Are you SOB when sitting or when up moving around?  When up and moving   4. Are you currently experiencing any other symptoms?  No

## 2021-05-01 NOTE — Telephone Encounter (Signed)
Spoke w patient. He reports SOB and getting tired sooner than he thinks he should while working outside.  He has to stop and rest.  The dyspnea is not new and was present when he was here last in 2021.  It seems to be worse tho.  Denies chest pain, swelling.  No other reported symptoms.   I have moved him to an opening w Dr. Angelena Form on 05/03/21.  Pt appreciative for assistance.

## 2021-05-03 ENCOUNTER — Encounter: Payer: Self-pay | Admitting: Cardiovascular Disease

## 2021-05-03 ENCOUNTER — Ambulatory Visit: Payer: Medicare Other | Admitting: Cardiovascular Disease

## 2021-05-03 ENCOUNTER — Other Ambulatory Visit: Payer: Self-pay

## 2021-05-03 VITALS — BP 118/64 | HR 68 | Ht 73.0 in | Wt 183.0 lb

## 2021-05-03 DIAGNOSIS — I351 Nonrheumatic aortic (valve) insufficiency: Secondary | ICD-10-CM | POA: Diagnosis not present

## 2021-05-03 DIAGNOSIS — I2510 Atherosclerotic heart disease of native coronary artery without angina pectoris: Secondary | ICD-10-CM | POA: Diagnosis not present

## 2021-05-03 DIAGNOSIS — I10 Essential (primary) hypertension: Secondary | ICD-10-CM

## 2021-05-03 DIAGNOSIS — I712 Thoracic aortic aneurysm, without rupture, unspecified: Secondary | ICD-10-CM

## 2021-05-03 DIAGNOSIS — E7800 Pure hypercholesterolemia, unspecified: Secondary | ICD-10-CM | POA: Diagnosis not present

## 2021-05-03 NOTE — Patient Instructions (Signed)
Medication Instructions:  No changes *If you need a refill on your cardiac medications before your next appointment, please call your pharmacy*   Lab Work: none If you have labs (blood work) drawn today and your tests are completely normal, you will receive your results only by: [place] (if you have MyChart) OR A paper copy in the mail If you have any lab test that is abnormal or we need to change your treatment, we will call you to review the results.   Testing/Procedures: none   Follow-Up: At [facility], you and your health needs are our priority.  As part of our continuing mission to provide you with exceptional heart care, we have created designated Provider Care Teams.  These Care Teams include your primary Cardiologist (physician) and Advanced Practice Providers (APPs -  Physician Assistants and Nurse Practitioners) who all work together to provide you with the care you need, when you need it.   Your next appointment:   12 month(s)  The format for your next appointment:   In Person  Provider:   You may see Lauree Chandler, MD or one of the following Advanced Practice Providers on your designated Care Team:   Melina Copa, PA-C Ermalinda Barrios, PA-C

## 2021-05-03 NOTE — Progress Notes (Signed)
Chief Complaint  Patient presents with   Follow-up    CAD    History of Present Illness: 85 yo male with history of CAD, thoracic aortic aneurysm, prostate cancer, HTN and HLD who is here today for cardiac follow up. He was seen in 2019 for evaluation of dyspnea. He was known to have normal LV function by echo in 2010. He had normal ABI in 2011. Cardiac cath in 2007 with mild luminal irregularities in the LAD. Nuclear stress test January 2019 with possible ischemia. Echo 09/02/17 showed normal LV systolic function, TZGY=17-49%. Grade 1 diastolic dysfunction, mild AI and mild dilation of the aortic root. Cardiac cath on 09/05/17 with mild non-obstructive CAD (20% mid RCA stenosis, 30% proximal LAD stenosis, 40% ostial Diagonal stenosis). Echo May 2021 with LVEF=55%, mild MR, mild AI.   He is here today for follow up. The patient denies any chest pain, palpitations, lower extremity edema, orthopnea, PND, dizziness, near syncope or syncope. He has dyspnea for several years but mildly worse now when working in the yard. Also feels an overall lack of energy.     Primary Care Physician: Lorene Dy, MD  Past Medical History:  Diagnosis Date   23-polyvalent pneumococcal polysaccharide vaccine contraindicated as received shingles vaccine within last 4 weeks 2008   SHINGLES SHOT IN 2008   Arthritis    Benign prostatic hypertrophy    history of   ED (erectile dysfunction) of organic origin    history of   Heart murmur    Hyperlipidemia    Hypertension    Neuromuscular disorder ([place])    Other primary cardiomyopathies    Palpitations    Prostate cancer ([place])    Vaccine for streptococcus pneumoniae and influenza 05/12/14   FLUE SHOT 0N 05/12/14 and PNEUMONIA 0N 05/13/14   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[place])     Past Surgical History:  Procedure Laterality Date   CERVICAL FUSION     c5 to c7 posterior   COLON RESECTION  1995   5 inches removed   COLONOSCOPY  01/15/12   EYE SURGERY     LEFT HEART CATH AND  CORONARY ANGIOGRAPHY N/A 09/05/2017   Procedure: LEFT HEART CATH AND CORONARY ANGIOGRAPHY;  Surgeon: [name], MD;  Location: [facility];  Service: Cardiovascular;  Laterality: N/A;   LESION REMOVAL Left 03/18/2013   Procedure: DEBRIDEMENT LEFT LONG DISTAL INTERPHALANGEAL JOINT/DEBRIDEMENT DIP JOINT LOOSE BODIES LEFT LONG DIP;  Surgeon: Cammie Sickle., MD;  Location: [facility];  Service: Orthopedics;  Laterality: Left;   PARTIAL COLECTOMY     PROSTATE SURGERY  2005   SMALL INTESTINE SURGERY      Current Outpatient Medications  Medication Sig Dispense Refill   Ascorbic Acid (VITAMIN C PO) Take 2,000 mg by mouth daily.     Cholecalciferol (VITAMIN D3) 2000 UNITS TABS Take 1 tablet by mouth daily.      clonazePAM (KLONOPIN) 0.5 MG tablet Take 0.5-1 tablets (0.25-0.5 mg total) by mouth at bedtime. 30 tablet 3   Glucosamine-Chondroit-Vit C-Mn (GLUCOSAMINE 1500 COMPLEX PO) Take 1 tablet by mouth daily.      Multiple Vitamins-Minerals (MULTIVITAMIN ADULTS 50+) TABS Take 1 tablet by mouth daily.     rosuvastatin (CRESTOR) 5 MG tablet Take 5 mg by mouth daily.     tadalafil (CIALIS) 20 MG tablet Take 20 mg by mouth daily as needed for erectile dysfunction.     tamsulosin (FLOMAX) 0.4 MG CAPS capsule Take 0.8 mg by mouth  daily.      tiZANidine (ZANAFLEX) 4 MG tablet Take 4 mg by mouth every 6 (six) hours as needed for muscle spasms.     No current facility-administered medications for this visit.    No Known Allergies  Social History   Socioeconomic History   Marital status: Married    Spouse name: Pamala Hurry   Number of children: 2   Years of education: College    Highest education level: Not on file  Occupational History   Occupation: retired    Comment: 2003  Tobacco Use   Smoking status: Never   Smokeless tobacco: Never  Substance and Sexual Activity   Alcohol use: No    Alcohol/week: 0.0 standard drinks   Drug use: No   Sexual activity:  Never    Birth control/protection: Abstinence  Other Topics Concern   Not on file  Social History Narrative   Patient lives at home with his spouse.   Caffeine Use: 6 cups daily   Social Determinants of Health   Financial Resource Strain: Not on file  Food Insecurity: Not on file  Transportation Needs: Not on file  Physical Activity: Not on file  Stress: Not on file  Social Connections: Not on file  Intimate Partner Violence: Not on file    Family History  Problem Relation Age of Onset   Coronary artery disease Other        family history   Stroke Mother        also heart problems   Kidney failure Father        also heart problems   Lung cancer Father     Review of Systems:  As stated in the HPI and otherwise negative.   BP 118/64   Pulse 68   Ht 6\' 1"  (1.854 m)   Wt 183 lb (83 kg)   SpO2 97%   BMI 24.14 kg/m   Physical Examination:  General: Well developed, well nourished, NAD  HEENT: OP clear, mucus membranes moist  SKIN: warm, dry. No rashes. Neuro: No focal deficits  Musculoskeletal: Muscle strength 5/5 all ext  Psychiatric: Mood and affect normal  Neck: No JVD, no carotid bruits, no thyromegaly, no lymphadenopathy.  Lungs:Clear bilaterally, no wheezes, rhonci, crackles Cardiovascular: Regular rate and rhythm. No murmurs, gallops or rubs. Abdomen:Soft. Bowel sounds present. Non-tender.  Extremities: No lower extremity edema. Pulses are 2 + in the bilateral DP/PT.  Echo May 2021:   1. Left ventricular ejection fraction, by estimation, is 55%. The left  ventricle has normal function. The left ventricle has no regional wall  motion abnormalities. There is mild left ventricular hypertrophy. Left  ventricular diastolic parameters are  consistent with Grade I diastolic dysfunction (impaired relaxation).   2. Right ventricular systolic function is normal. The right ventricular  size is normal. Tricuspid regurgitation signal is inadequate for assessing  PA  pressure.   3. Left atrial size was mildly dilated.   4. Mild posterior leaflet mitral valve prolapse noted. Mild eccentric,  anteriorly-directed mitral valve regurgitation. No evidence of mitral  stenosis.   5. The aortic valve is tricuspid. Aortic valve regurgitation is mild.  Mild aortic valve sclerosis is present, with no evidence of aortic valve  stenosis.   6. Aortic dilatation noted. There is mild dilatation of the aortic root  measuring 39 mm.   7. The inferior vena cava is normal in size with greater than 50%  respiratory variability, suggesting right atrial pressure of 3 mmHg.   Cardiac  cath 09/05/17:  Prox RCA to Mid RCA lesion is 20% stenosed. Prox LAD lesion is 30% stenosed. Ost 1st Diag lesion is 40% stenosed.  Diagnostic Diagram       Implants        EKG:  EKG is ordered today. The ekg ordered today demonstrates sinus, rate 68 bpm.   Recent Labs: No results found for requested labs within last 8760 hours.   Lipid Panel No results found for: CHOL, TRIG, HDL, CHOLHDL, VLDL, LDLCALC, LDLDIRECT   Wt Readings from Last 3 Encounters:  05/03/21 183 lb (83 kg)  02/20/21 180 lb (81.6 kg)  11/29/19 181 lb 1.9 oz (82.2 kg)     Other studies Reviewed: Additional studies/ records that were reviewed today include: . Review of the above records demonstrates:    Assessment and Plan:   1. CAD without angina: He has mild CAD by cath January 2019. LV function is normal by echo May 2021. He has no chest pain. Will continue ASA and statin.     2. HTN: BP is well controlled. No changes today  3. HLD: Lipids followed in primary care. Will continue statin.    4. Aortic valve insufficiency: Mild by echo May 2021.   5. Thoracic aortic aneurysm: Mildly dilated by echo over the past few years but no change on echo last year. We discussed arranging a CTA of his aorta but he wishes to wait.   Current medicines are reviewed at length with the patient today.  The patient does  not have concerns regarding medicines.  The following changes have been made:  no change  Labs/ tests ordered today include:   Orders Placed This Encounter  Procedures   EKG 12-Lead      Disposition:   FU with me in 12 months   Signed, Lauree Chandler, MD 05/03/2021 4:21 PM    [facility] [place], [place], [place]  [address] Phone: [phone]; Fax: [phone]

## 2021-07-18 ENCOUNTER — Ambulatory Visit: Payer: Medicare Other | Admitting: Cardiovascular Disease

## 2021-07-30 ENCOUNTER — Ambulatory Visit
Admission: RE | Admit: 2021-07-30 | Discharge: 2021-07-30 | Disposition: A | Discharge status: Home / Self Care | Payer: Medicare Other | Source: Ambulatory Visit | Attending: Gastroenterology | Admitting: Gastroenterology

## 2021-07-30 ENCOUNTER — Other Ambulatory Visit: Payer: Self-pay | Admitting: Gastroenterology

## 2021-07-30 DIAGNOSIS — K5909 Other constipation: Secondary | ICD-10-CM

## 2021-10-30 ENCOUNTER — Other Ambulatory Visit: Payer: Self-pay | Admitting: Gastroenterology

## 2021-10-30 DIAGNOSIS — K6289 Other specified diseases of anus and rectum: Secondary | ICD-10-CM

## 2021-10-31 ENCOUNTER — Ambulatory Visit
Admission: RE | Admit: 2021-10-31 | Discharge: 2021-10-31 | Disposition: A | Discharge status: Home / Self Care | Payer: Medicare Other | Source: Ambulatory Visit | Attending: Gastroenterology | Admitting: Gastroenterology

## 2021-10-31 ENCOUNTER — Other Ambulatory Visit: Payer: Self-pay

## 2021-10-31 DIAGNOSIS — K6289 Other specified diseases of anus and rectum: Secondary | ICD-10-CM

## 2021-10-31 MED ORDER — IOPAMIDOL (ISOVUE-300) INJECTION 61%
100.0000 mL | Freq: Once | INTRAVENOUS | Status: AC | PRN
  Administered 2021-10-31: 100 mL via INTRAVENOUS

## 2022-04-09 ENCOUNTER — Ambulatory Visit (INDEPENDENT_AMBULATORY_CARE_PROVIDER_SITE_OTHER): Payer: Medicare Other

## 2022-04-09 ENCOUNTER — Ambulatory Visit: Payer: Medicare Other | Admitting: Orthopaedic Surgery

## 2022-04-09 ENCOUNTER — Encounter: Payer: Self-pay | Admitting: Orthopaedic Surgery

## 2022-04-09 DIAGNOSIS — G8929 Other chronic pain: Secondary | ICD-10-CM | POA: Diagnosis not present

## 2022-04-09 DIAGNOSIS — M545 Low back pain, unspecified: Secondary | ICD-10-CM | POA: Diagnosis not present

## 2022-04-09 DIAGNOSIS — M19031 Primary osteoarthritis, right wrist: Secondary | ICD-10-CM

## 2022-04-09 MED ORDER — PREDNISONE 5 MG (21) PO TBPK: ORAL_TABLET | ORAL | 0 refills | Status: AC

## 2022-04-09 MED ORDER — TRAMADOL HCL 50 MG PO TABS: 50.0000 mg | ORAL_TABLET | Freq: Two times a day (BID) | ORAL | 2 refills | Status: AC | PRN

## 2022-04-09 NOTE — Progress Notes (Signed)
Office Visit Note   Patient: Kevin Knight           Date of Birth: 24-Dec-1935           MRN: [id number] Visit Date: 04/09/2022              Requested by: Lorene Dy, MD [address] [place],  [place] [address] PCP: Lorene Dy, MD   Assessment & Plan: Visit Diagnoses:  1. Chronic left-sided low back pain, unspecified whether sciatica present   2. Localized primary osteoarthritis of carpometacarpal (CMC) joint of right wrist     Plan: Impression is advanced multilevel degenerative changes to the lower lumbar spine in addition to right thumb CMC osteoarthritis.  In regards to the back, I discussed starting him on a steroid taper.  I would also like to go ahead and get an MRI to further assess for structural abnormalities.  We will follow-up with [place] once this is been completed.  In regards to the thumb, we have discussed various treatment options to include repeat cortisone injection versus CMC arthroplasty surgery.  He would like to think about the surgery for now.  Follow-up as needed for his thumb.  Call with concerns or questions.  Follow-Up Instructions: Return for after lumbar spine mri.   Orders:  Orders Placed This Encounter  Procedures   XR Lumbar Spine 2-3 Views   MR Lumbar Spine w/o contrast   Meds ordered this encounter  Medications   predniSONE (STERAPRED UNI-PAK 21 TAB) 5 MG (21) TBPK tablet    Sig: Take as directed    Dispense:  21 tablet    Refill:  0   traMADol (ULTRAM) 50 MG tablet    Sig: Take 1 tablet (50 mg total) by mouth every 12 (twelve) hours as needed.    Dispense:  30 tablet    Refill:  2      Procedures: No procedures performed   Clinical Data: No additional findings.   Subjective: Chief Complaint  Patient presents with   Lower Back - Pain    HPI patient is a pleasant 86 year old gentleman who comes in today with continued right thumb pain in addition to continued left low back pain.  In regards to his thumb, he has had  pain for few years which has been primarily to the [facility] joint.  He has had this injected twice without significant relief.  He is having trouble gripping.  He does note occasional paresthesias for which is relieved wearing a latex glove.  In regards to his low back, he has had pain to the left lower side for over a year.  Pain is worse with lumbar flexion.  He denies any paresthesias or pain going down either lower extremity.  He has been to physical therapy for several months in the past without relief.  No recent MRI of the lumbar spine.  Review of Systems as detailed in HPI.  All others reviewed and are negative.   Objective: Vital Signs: There were no vitals taken for this visit.  Physical Exam well-developed well-nourished gentleman in no acute distress.  Alert and oriented x3.  Ortho Exam lumbar spine exam shows minimal tenderness to the lower lumbar level.  Increased pain with lumbar flexion.  Negative straight leg raise.  No focal weakness.  He is neurovascular tact distally.  Right thumb exam there is moderate tenderness to the [facility] joint.  Crepitus with grind test.    Specialty Comments:  No specialty comments available.  Imaging: XR Lumbar Spine 2-3 Views  Result Date: 04/09/2022 X-rays demonstrate underlying scoliosis and advanced multilevel degenerative changes    PMFS History: Patient Active Problem List   Diagnosis Date Noted   Chronic midline low back pain without sciatica 02/20/2021   Paresthesias in right hand 09/08/2018   Abnormal stress test    Shortness of breath    LIMB PAIN 04/24/2010   HYPERTENSION, BENIGN 04/20/2009   MITRAL VALVE DISORDERS 04/20/2009   CARDIOMYOPATHY, SECONDARY 04/20/2009   HYPERLIPIDEMIA 04/16/2009   HYPERTENSION, UNSPECIFIED 04/16/2009   CARDIOMYOPATHY 04/16/2009   PALPITATIONS 04/16/2009   ERECTILE DYSFUNCTION, ORGANIC, HX OF 04/16/2009   BENIGN PROSTATIC HYPERTROPHY, HX OF 04/16/2009   Past Medical History:  Diagnosis Date    23-polyvalent pneumococcal polysaccharide vaccine contraindicated as received shingles vaccine within last 4 weeks 2008   SHINGLES SHOT IN 2008   Arthritis    Benign prostatic hypertrophy    history of   ED (erectile dysfunction) of organic origin    history of   Heart murmur    Hyperlipidemia    Hypertension    Neuromuscular disorder (HCC)    Other primary cardiomyopathies    Palpitations    Prostate cancer ([place])    Vaccine for streptococcus pneumoniae and influenza 05/12/14   FLUE SHOT 0N 05/12/14 and PNEUMONIA 0N 05/13/14   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[place])     Family History  Problem Relation Age of Onset   Coronary artery disease Other        family history   Stroke Mother        also heart problems   Kidney failure Father        also heart problems   Lung cancer Father     Past Surgical History:  Procedure Laterality Date   CERVICAL FUSION     c5 to c7 posterior   COLON RESECTION  1995   5 inches removed   COLONOSCOPY  01/15/12   EYE SURGERY     LEFT HEART CATH AND CORONARY ANGIOGRAPHY N/A 09/05/2017   Procedure: LEFT HEART CATH AND CORONARY ANGIOGRAPHY;  Surgeon: [name], MD;  Location: [facility];  Service: Cardiovascular;  Laterality: N/A;   LESION REMOVAL Left 03/18/2013   Procedure: DEBRIDEMENT LEFT LONG DISTAL INTERPHALANGEAL JOINT/DEBRIDEMENT DIP JOINT LOOSE BODIES LEFT LONG DIP;  Surgeon: Cammie Sickle., MD;  Location: [facility];  Service: Orthopedics;  Laterality: Left;   PARTIAL COLECTOMY     PROSTATE SURGERY  2005   SMALL INTESTINE SURGERY     Social History   Occupational History   Occupation: retired    Comment: 2003  Tobacco Use   Smoking status: Never   Smokeless tobacco: Never  Substance and Sexual Activity   Alcohol use: No    Alcohol/week: 0.0 standard drinks of alcohol   Drug use: No   Sexual activity: Never    Birth control/protection: Abstinence

## 2022-04-11 ENCOUNTER — Emergency Department (HOSPITAL_COMMUNITY)
Admission: EM | Admit: 2022-04-11 | Discharge: 2022-04-12 | Disposition: E | Payer: Medicare Other | Attending: Emergency Medicine | Admitting: Emergency Medicine

## 2022-04-11 DIAGNOSIS — I10 Essential (primary) hypertension: Secondary | ICD-10-CM | POA: Diagnosis not present

## 2022-04-11 DIAGNOSIS — I469 Cardiac arrest, cause unspecified: Secondary | ICD-10-CM | POA: Diagnosis not present

## 2022-04-11 DIAGNOSIS — I2510 Atherosclerotic heart disease of native coronary artery without angina pectoris: Secondary | ICD-10-CM | POA: Diagnosis not present

## 2022-04-11 MED ORDER — EPINEPHRINE 1 MG/10ML IJ SOSY
PREFILLED_SYRINGE | INTRAMUSCULAR | Status: AC | PRN
  Administered 2022-04-11 (×2): 1 mg via INTRAVENOUS

## 2022-04-12 NOTE — ED Triage Notes (Addendum)
Pt BIB EMS as a CPR in progress following a witnessed collapse. Prior to event, pt was complaining of left sided chest pain. CPR was initiated at 1526. King airway in place. Pt was in asystole upon EMS arrival. Pt was given 8-10 epis en route with EMS. Pt had no pulses throughout the duration of transport. Capnography 30-60. CBG 339. Pt received 1L of NS. Pt arrives with IO to the left tibia and an 18# to the [place].

## 2022-04-12 NOTE — Code Documentation (Signed)
Patient time of death occurred at 1637 per Vanita Panda, MD.

## 2022-04-12 NOTE — ED Notes (Signed)
Pt has a remaining yellow color watch and ring to the left hand.

## 2022-04-12 NOTE — ED Provider Notes (Signed)
[facility] EMERGENCY DEPARTMENT Provider Note   CSN: [id number] Arrival date & time: 04/16/22  1629     History  Chief Complaint  Patient presents with   Cardiac Arrest    Kevin Knight is a 86 y.o. male. Arrived via EMS with active CPR.  Per EMS, patient was out mowing the lawn he reported feeling unwell and then had a witnessed collapse.  EMS was called and CPR was started on scene. HPI     Home Medications Prior to Admission medications   Medication Sig Start Date End Date Taking? Authorizing Provider  Ascorbic Acid (VITAMIN C PO) Take 2,000 mg by mouth daily.    [provider]  Cholecalciferol (VITAMIN D3) 2000 UNITS TABS Take 1 tablet by mouth daily.     [provider]  clonazePAM (KLONOPIN) 0.5 MG tablet Take 0.5-1 tablets (0.25-0.5 mg total) by mouth at bedtime. 07/12/14   Penumalli, Earlean Polka, MD  Glucosamine-Chondroit-Vit C-Mn (GLUCOSAMINE 1500 COMPLEX PO) Take 1 tablet by mouth daily.     [provider]  Multiple Vitamins-Minerals (MULTIVITAMIN ADULTS 50+) TABS Take 1 tablet by mouth daily.    [provider]  predniSONE (STERAPRED UNI-PAK 21 TAB) 5 MG (21) TBPK tablet Take as directed 04/09/22   Aundra Dubin, PA-C  rosuvastatin (CRESTOR) 5 MG tablet Take 5 mg by mouth daily. 11/17/19   [provider]  tadalafil (CIALIS) 20 MG tablet Take 20 mg by mouth daily as needed for erectile dysfunction.    [provider]  tamsulosin (FLOMAX) 0.4 MG CAPS capsule Take 0.8 mg by mouth daily.     [provider]  tiZANidine (ZANAFLEX) 4 MG tablet Take 4 mg by mouth every 6 (six) hours as needed for muscle spasms.    [provider]  traMADol (ULTRAM) 50 MG tablet Take 1 tablet (50 mg total) by mouth every 12 (twelve) hours as needed. 04/09/22   Aundra Dubin, PA-C      Allergies    Patient has no known allergies.    Review of Systems   Review of Systems  Unable to perform ROS:  Patient unresponsive    Physical Exam Updated Vital Signs Ht '6\' 1"'$  (1.854 m)   Wt 83 kg   BMI 24.14 kg/m  Physical Exam Vitals and nursing note reviewed.  Constitutional:      General: He is in acute distress.     Appearance: He is well-developed. He is ill-appearing.  Eyes:     Comments: 64m equal, not reactive  Cardiovascular:     Comments: No pulses Pulmonary:     Effort: Respiratory distress present.     Breath sounds: Normal breath sounds. Stridor present.     Comments: King airway in place, assisted ventilations  Abdominal:     General: There is no distension.     Palpations: Abdomen is soft.  Musculoskeletal:     Cervical back: Neck supple.  Skin:    Capillary Refill: Capillary refill takes more than 3 seconds.     Coloration: Skin is pale.     Comments: Abrasion to right forehead  Remote appearing abrasion to right anterior knee  Cool extremities   IO to left tibia     ED Results / Procedures / Treatments   Labs (all labs ordered are listed, but only abnormal results are displayed) Labs Reviewed - No data to display  EKG None  Radiology No results found.  Procedures Procedures    Medications  Ordered in ED Medications  EPINEPHrine (ADRENALIN) 1 MG/10ML injection (1 mg Intravenous Given 2022/05/06 1635)    ED Course/ Medical Decision Making/ A&P                           Medical Decision Making Risk Prescription drug management.   86 year old male with past medical history of nonobstructive CAD, hypertension, and thoracic aortic aneurysm presenting via EMS after a witnessed collapse while mowing the lawn.  EMS started CPR and placed a [facility] airway and provided assisted ventilations.  Per EMS, prior to arrival monitor showed PEA and asystole throughout code. EMS performed CPR started at 3:26 PM, which is just over 1 hour of CPR administered prior to arrival. EMS placed 1 peripheral IV and 1 IO to the left tibia.  1 L normal saline given prior to  arrival. patient had had approximately 8-10 rounds of epinephrine.  On arrival, king airway in place and bilateral breath sounds heard with assisted ventilations with BVM. This was continued.  CPR continued with [facility] device. Additional point of access placed, now with 3 points of access.  PEA noted on monitor during first pulse check. Additional '1mg'$  epinephrine given. POCUS without any cardiac activity. CPR continued.  1 additional round of epinephrine given. On repeat pulse check, no pulses palpated, POCUS without any cardiac activity, and asystole on monitor. At this time team agreed all measures had been taken.  Time of death called at 4:37PM.   I spoke with patient's wife and daughter extensively and all questions were answered.         Final Clinical Impression(s) / ED Diagnoses Final diagnoses:  None    Rx / DC Orders ED Discharge Orders     None         Rosine Abe, MD 2022-05-06 Erskin Burnet    Carmin Muskrat, MD 05-06-22 [id number]

## 2022-04-12 DEATH — deceased
